# Patient Record
Sex: Female | Born: 1985
Health system: Southern US, Community
[De-identification: ages and names within clinical notes are randomized; demographics above are authoritative.]

## PROBLEM LIST (undated history)

## (undated) ENCOUNTER — Ambulatory Visit: Payer: BC Managed Care – PPO | Source: Home / Self Care

## (undated) ENCOUNTER — Ambulatory Visit: Admission: EM | Payer: BC Managed Care – PPO | Source: Home / Self Care

## (undated) DIAGNOSIS — R51 Headache: Secondary | ICD-10-CM

## (undated) DIAGNOSIS — K5900 Constipation, unspecified: Secondary | ICD-10-CM

## (undated) DIAGNOSIS — D509 Iron deficiency anemia, unspecified: Secondary | ICD-10-CM

## (undated) DIAGNOSIS — L309 Dermatitis, unspecified: Secondary | ICD-10-CM

## (undated) DIAGNOSIS — R519 Headache, unspecified: Secondary | ICD-10-CM

## (undated) HISTORY — DX: Headache: R51

## (undated) HISTORY — DX: Iron deficiency anemia, unspecified: D50.9

## (undated) HISTORY — DX: Headache, unspecified: R51.9

## (undated) HISTORY — DX: Dermatitis, unspecified: L30.9

## (undated) HISTORY — DX: Constipation, unspecified: K59.00

---
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
## 2011-02-18 ENCOUNTER — Other Ambulatory Visit: Payer: Self-pay | Admitting: Obstetrics and Gynecology

## 2011-02-18 DIAGNOSIS — N6320 Unspecified lump in the left breast, unspecified quadrant: Secondary | ICD-10-CM

## 2011-02-19 ENCOUNTER — Other Ambulatory Visit: Payer: Self-pay

## 2011-02-23 ENCOUNTER — Other Ambulatory Visit: Payer: Self-pay

## 2011-03-01 ENCOUNTER — Ambulatory Visit
Admission: RE | Admit: 2011-03-01 | Discharge: 2011-03-01 | Disposition: A | Discharge status: Home / Self Care | Payer: Managed Care, Other (non HMO) | Source: Ambulatory Visit | Attending: Obstetrics and Gynecology | Admitting: Obstetrics and Gynecology

## 2011-03-01 DIAGNOSIS — N6320 Unspecified lump in the left breast, unspecified quadrant: Secondary | ICD-10-CM

## 2011-09-28 ENCOUNTER — Ambulatory Visit (INDEPENDENT_AMBULATORY_CARE_PROVIDER_SITE_OTHER): Payer: Managed Care, Other (non HMO) | Admitting: Family Medicine

## 2011-09-28 VITALS — BP 125/75 | HR 64 | Temp 98.3°F | Resp 16 | Ht 66.0 in | Wt 120.0 lb

## 2011-09-28 DIAGNOSIS — K529 Noninfective gastroenteritis and colitis, unspecified: Secondary | ICD-10-CM

## 2011-09-28 DIAGNOSIS — K5289 Other specified noninfective gastroenteritis and colitis: Secondary | ICD-10-CM

## 2011-09-28 LAB — POCT CBC
Granulocyte percent: 45.9 %G (ref 37–80)
HCT, POC: 38.4 % (ref 37.7–47.9)
Hemoglobin: 11.8 g/dL — AB (ref 12.2–16.2)
Lymph, poc: 2.2 (ref 0.6–3.4)
MCH, POC: 25.5 pg — AB (ref 27–31.2)
MCHC: 30.7 g/dL — AB (ref 31.8–35.4)
MCV: 83 fL (ref 80–97)
MID (cbc): 0.4 (ref 0–0.9)
MPV: 10.7 fL (ref 0–99.8)
POC Granulocyte: 2.2 (ref 2–6.9)
POC LYMPH PERCENT: 46.3 %L (ref 10–50)
POC MID %: 7.8 %M (ref 0–12)
Platelet Count, POC: 278 10*3/uL (ref 142–424)
RBC: 4.63 M/uL (ref 4.04–5.48)
RDW, POC: 16 %
WBC: 4.7 10*3/uL (ref 4.6–10.2)

## 2011-09-28 LAB — POCT SEDIMENTATION RATE: POCT SED RATE: 9 mm/hr (ref 0–22)

## 2011-09-28 MED ORDER — FLUCONAZOLE 150 MG PO TABS: ORAL_TABLET | ORAL | Status: DC

## 2011-09-28 MED ORDER — CIPROFLOXACIN HCL 500 MG PO TABS: 500.0000 mg | ORAL_TABLET | Freq: Two times a day (BID) | ORAL | Status: AC

## 2011-09-28 NOTE — Patient Instructions (Signed)
Travelers' Diarrhea Travelers' diarrhea (TD) is the most common illness affecting travelers. Each year many travelers develop diarrhea. TD usually occurs within the first week of travel. However, it may occur at any time while traveling. It may even occur after returning home. The most important risk factor is where you are going. High-risk places are the developing countries of:  [place].   [place].   The [place].   [place].  High risk people include young adults and those with:  Transplants.   HIV infections.   Medicine that suppresses the immune system.   Inflammatory-bowel disease.   Diabetes.   H-2 blockers or antacids.  Attack rates are similar for men and women. The primary source of TD is eating or drinking food or water tainted with feces (stool or bowel movements). CAUSES  Infectious agents are the primary cause of TD. Germs cause almost 80% of TD cases. The most common germ produces:  Watery diarrhea with cramps.   Low-grade or no fever.  There are many other bacterial, viral and parasitic pathogens (disease causing "bugs").  SYMPTOMS  Most TD cases begin suddenly. Symptoms include stool that is increased in:  Frequency.   Volume.   Weight.  Altered stool consistency also is common. Typically, you have four to five loose or watery bowel movements each day. Other common symptoms are:  Nausea.   Vomiting.   Diarrhea.   Abdominal cramping.   Bloating.   Fever.   Urgency.   Malaise.  Most cases are not dangerous. Most cases go away in 1-2 days without treatment. TD is rarely life threatening. 90% of cases resolve within 1 week. 98% resolve within 1 month. PREVENTION   Avoid foods or beverages purchased from street vendors in high risk countries.   Avoid food from places where unclean conditions are present.   Avoid raw or undercooked meat and seafood.   Avoid raw fruits (e.g., oranges, bananas, avocados) and vegetables unless you peel them  yourself.   If handled properly, well-cooked and packaged foods usually are safe. Foods associated with increased risk for TD include:   Tap water.   Ice.   Unpasteurized milk.   Dairy products.   Safe beverages include:   Bottled carbonated beverages.   Hot tea or coffee.   Beer.   Wine.   Boiled water.   Water treated with iodine or chlorine.  ANTIBIOTICS ARE NOT RECOMMENDED AS PREVENTION  CDC (Centers for Disease Control) does not recommend antimicrobial drugs (medicine that kill germs) to prevent TD. Several studies show that Pepto-Bismol taken as either 2 tablets 4 times daily, or 2 fluid ounces 4 times daily, reduces the incidence of travelers' diarrhea. People that should avoid Pepto-Bismol include those who are:   Pregnant.   Allergic to aspirin.   Taking anticoagulants medicine (probenecid, methotrexate).   Be informed about potential side effects, in particular about temporary blackening of the tongue and stool, and rarely ringing in the ears. Because of potential adverse side effects, preventative Pepto-Bismol should not be used for more than 3 weeks.   Some antibiotics taken in a once-a-day dose are 90% effective at preventing travelers' diarrhea. However, antibiotics are not recommended as prevention. Routine antimicrobial prophylaxis increases your risk for:   Adverse reactions.   Infections with resistant organisms.   Antibiotics can increase your susceptibility to resistant bacterial pathogens and provide no protection against either viral or parasitic pathogens. This can give travelers a false sense of security. As a result, strict adherence   to preventive measures is encouraged. Pepto-Bismol should be used as an extra effort if prophylaxis is needed.  TREATMENT   TD usually is a self-limited disorder. It gets well without treatment. It often goes away without specific treatment. Oral re-hydration is often helpful to replace lost fluids and  electrolytes. Clear liquids are routinely recommended for adults. You may be helped with antimicrobial therapy if you develop three or more loose stools in an 8-hour period, especially if associated with:   Nausea.   Vomiting.   Abdominal cramps.   Fever.   Blood in stools.   Antibiotics usually are given for 3-5 days. Pepto-Bismol also may be used as treatment. Take one fluid ounce, or two 262 mg tablets every 30 minutes, for up to 8 doses in a 24-hour period. This can be repeated on a second day. If diarrhea persists despite therapy, you should be evaluated by a caregiver and treated for possible parasitic infection.   Because drug resistance is a continuing problem and may vary from country to country, professional assistance should be looked for if problems persist.   Antimotility agents (loperamide, diphenoxylate, and paregoric) mostly reduce diarrhea by slowing down the passage of food and drink in the gut. This allows more time for absorption. Some persons believe diarrhea is the body's defense mechanism to minimize contact time between gut pathogens and lining of the bowel. In several studies, antimotility agents have been useful in treating travelers' diarrhea by decreasing the duration of diarrhea. However, these agents should never be used by persons with fever or bloody diarrhea because they can increase the severity of disease by delaying clearance of causative organisms. Because antimotility agents are now available over the counter, their improper use is of concern. Complications have been reported from the use of these medicines such as:   Toxic megacolon.   Sepsis.   Disseminated intravascular coagulation.  SEEK IMMEDIATE MEDICAL CARE IF:   You are unable to keep fluids down.   Vomiting or diarrhea becomes persistent.   Abdominal (belly) pain develops or increases or localizes. (Right sided pain can be appendicitis and left sided pain in adults can be diverticulitis).     You develop an oral temperature above 102 F (38.9 C), or as your caregiver suggests.   Diarrhea becomes excessive or contains blood or mucous.   Excessive weakness, dizziness, fainting or extreme thirst.   Checking weight 2 to 3 times per day in babies and children will help verify adequate fluid replacement. Your caregiver will tell you what loss should concern you or suggest another visit to your personal physician.   Record your weight or your child's weight today. Compare this to your home scale and record all weights and time and date weighed. Try to check weight at the same times every day. Bring this chart to your caregivers if you or your child needs to be seen again.  FOR MORE INFORMATION  Travelers should consult with a caregiver before departing on a trip abroad. Information about TD is available from:  Your local or state health departments.   World Health Organization (WHO).  Other information that may be of interest to travelers can be found at the CDC Travelers' Health homepage at http://www.cdc.gov/travel. Document Released: 04/16/2002 Document Revised: 04/15/2011 Document Reviewed: 07/04/2008 ExitCare Patient Information 2012 [employer]. 

## 2011-09-28 NOTE — Progress Notes (Signed)
  Subjective:    Patient ID: Stacey Morrow, female    DOB: October 25, 1985, 26 y.o.   MRN: [id number]  HPI Comments: Abdominal cramps and diarrhea x 2 weeks.  Loose stools.  No blood in stools.  No fevers. No lightedness or dizziness.  Notable weight loss - about 4 pounds.  sxs started after a weekend in [facility] about 2 weeks ago.  No family history of UC or chrohn's disease.  Father with cholecystectomy.  No history of eating disorder.  Urinating ok.  History of irregular cycles.  Diarrhea   Abdominal Cramping Associated symptoms include diarrhea.      Review of Systems  Gastrointestinal: Positive for diarrhea.       Objective:   Physical Exam  Constitutional: She appears well-developed and well-nourished.  Cardiovascular: Normal rate, regular rhythm and normal heart sounds.  Exam reveals no gallop.   No murmur heard. Pulmonary/Chest: Effort normal and breath sounds normal.  Abdominal: Soft. Bowel sounds are normal. There is no hepatosplenomegaly. There is no tenderness. There is no rebound. Hernia confirmed negative in the ventral area.    Skin: Skin is warm and dry.  Psychiatric: She has a normal mood and affect.   Results for orders placed in visit on 09/28/11  POCT CBC      Component Value Range   WBC 4.7  4.6 - 10.2 (K/uL)   Lymph, poc 2.2  0.6 - 3.4    POC LYMPH PERCENT 46.3  10 - 50 (%L)   MID (cbc) 0.4  0 - 0.9    POC MID % 7.8  0 - 12 (%M)   POC Granulocyte 2.2  2 - 6.9    Granulocyte percent 45.9  37 - 80 (%G)   RBC 4.63  4.04 - 5.48 (M/uL)   Hemoglobin 11.8 (*) 12.2 - 16.2 (g/dL)   HCT, POC 40.9  81.1 - 47.9 (%)   MCV 83.0  80 - 97 (fL)   MCH, POC 25.5 (*) 27 - 31.2 (pg)   MCHC 30.7 (*) 31.8 - 35.4 (g/dL)   RDW, POC 91.4     Platelet Count, POC 278  142 - 424 (K/uL)   MPV 10.7  0 - 99.8 (fL)  CMET - pending       Assessment & Plan:  Gastroenteritis  cipro 500 mg bid x days  Diflucan 150 mg q3 days prn disp #2/ no RF  Increase fluids

## 2011-09-29 LAB — COMPREHENSIVE METABOLIC PANEL
ALT: 12 U/L (ref 0–35)
AST: 16 U/L (ref 0–37)
Albumin: 4.6 g/dL (ref 3.5–5.2)
Alkaline Phosphatase: 45 U/L (ref 39–117)
BUN: 12 mg/dL (ref 6–23)
CO2: 25 mEq/L (ref 19–32)
Calcium: 9.4 mg/dL (ref 8.4–10.5)
Chloride: 106 mEq/L (ref 96–112)
Creat: 0.64 mg/dL (ref 0.50–1.10)
Glucose, Bld: 77 mg/dL (ref 70–99)
Potassium: 3.9 mEq/L (ref 3.5–5.3)
Sodium: 138 mEq/L (ref 135–145)
Total Bilirubin: 0.5 mg/dL (ref 0.3–1.2)
Total Protein: 7.7 g/dL (ref 6.0–8.3)

## 2011-11-24 ENCOUNTER — Ambulatory Visit (INDEPENDENT_AMBULATORY_CARE_PROVIDER_SITE_OTHER): Payer: Managed Care, Other (non HMO) | Admitting: Emergency Medicine

## 2011-11-24 VITALS — BP 112/70 | HR 92 | Temp 99.1°F | Resp 18 | Ht 66.5 in | Wt 119.0 lb

## 2011-11-24 DIAGNOSIS — N898 Other specified noninflammatory disorders of vagina: Secondary | ICD-10-CM

## 2011-11-24 DIAGNOSIS — B9689 Other specified bacterial agents as the cause of diseases classified elsewhere: Secondary | ICD-10-CM

## 2011-11-24 DIAGNOSIS — N760 Acute vaginitis: Secondary | ICD-10-CM

## 2011-11-24 LAB — POCT WET PREP WITH KOH
KOH Prep POC: NEGATIVE
RBC Wet Prep HPF POC: NEGATIVE
Trichomonas, UA: NEGATIVE
Yeast Wet Prep HPF POC: NEGATIVE

## 2011-11-24 MED ORDER — METRONIDAZOLE 500 MG PO TABS: 500.0000 mg | ORAL_TABLET | Freq: Two times a day (BID) | ORAL | Status: AC

## 2011-11-24 NOTE — Patient Instructions (Signed)
Bacterial Vaginosis Bacterial vaginosis (BV) is a vaginal infection where the normal balance of bacteria in the vagina is disrupted. The normal balance is then replaced by an overgrowth of certain bacteria. There are several different kinds of bacteria that can cause BV. BV is the most common vaginal infection in women of childbearing age. CAUSES   The cause of BV is not fully understood. BV develops when there is an increase or imbalance of harmful bacteria.   Some activities or behaviors can upset the normal balance of bacteria in the vagina and put women at increased risk including:   Having a new sex partner or multiple sex partners.   Douching.   Using an intrauterine device (IUD) for contraception.   It is not clear what role sexual activity plays in the development of BV. However, women that have never had sexual intercourse are rarely infected with BV.  Women do not get BV from toilet seats, bedding, swimming pools or from touching objects around them.  SYMPTOMS   Grey vaginal discharge.   A fish-like odor with discharge, especially after sexual intercourse.   Itching or burning of the vagina and vulva.   Burning or pain with urination.   Some women have no signs or symptoms at all.  DIAGNOSIS  Your caregiver must examine the vagina for signs of BV. Your caregiver will perform lab tests and look at the sample of vaginal fluid through a microscope. They will look for bacteria and abnormal cells (clue cells), a pH test higher than 4.5, and a positive amine test all associated with BV.  RISKS AND COMPLICATIONS   Pelvic inflammatory disease (PID).   Infections following gynecology surgery.   Developing HIV.   Developing herpes virus.  TREATMENT  Sometimes BV will clear up without treatment. However, all women with symptoms of BV should be treated to avoid complications, especially if gynecology surgery is planned. Female partners generally do not need to be treated. However,  BV may spread between female sex partners so treatment is helpful in preventing a recurrence of BV.   BV may be treated with antibiotics. The antibiotics come in either pill or vaginal cream forms. Either can be used with nonpregnant or pregnant women, but the recommended dosages differ. These antibiotics are not harmful to the baby.   BV can recur after treatment. If this happens, a second round of antibiotics will often be prescribed.   Treatment is important for pregnant women. If not treated, BV can cause a premature delivery, especially for a pregnant woman who had a premature birth in the past. All pregnant women who have symptoms of BV should be checked and treated.   For chronic reoccurrence of BV, treatment with a type of prescribed gel vaginally twice a week is helpful.  HOME CARE INSTRUCTIONS   Finish all medication as directed by your caregiver.   Do not have sex until treatment is completed.   Tell your sexual partner that you have a vaginal infection. They should see their caregiver and be treated if they have problems, such as a mild rash or itching.   Practice safe sex. Use condoms. Only have 1 sex partner.  PREVENTION  Basic prevention steps can help reduce the risk of upsetting the natural balance of bacteria in the vagina and developing BV:  Do not have sexual intercourse (be abstinent).   Do not douche.   Use all of the medicine prescribed for treatment of BV, even if the signs and symptoms go away.     Tell your sex partner if you have BV. That way, they can be treated, if needed, to prevent reoccurrence.  SEEK MEDICAL CARE IF:   Your symptoms are not improving after 3 days of treatment.   You have increased discharge, pain, or fever.  MAKE SURE YOU:   Understand these instructions.   Will watch your condition.   Will get help right away if you are not doing well or get worse.  FOR MORE INFORMATION  Division of STD Prevention (DSTDP), Centers for Disease  Control and Prevention: www.cdc.gov/std American Social Health Association (ASHA): www.ashastd.org  Document Released: 04/26/2005 Document Revised: 04/15/2011 Document Reviewed: 10/17/2008 ExitCare Patient Information 2012 [employer]. 

## 2011-11-24 NOTE — Progress Notes (Signed)
  Subjective:    Patient ID: Stacey Morrow, female    DOB: 15-Oct-1985, 26 y.o.   MRN: [id number]  Gynecologic Exam The patient's primary symptoms include genital lesions and a vaginal discharge. The patient's pertinent negatives include no genital itching, genital odor, genital rash, missed menses, pelvic pain or vaginal bleeding. This is a new problem. The current episode started yesterday. The problem occurs constantly. The problem has been unchanged. The patient is experiencing no pain. She is not pregnant. Pertinent negatives include no abdominal pain, anorexia, back pain, chills, constipation, diarrhea, discolored urine, dysuria, fever, flank pain, frequency, headaches, hematuria, joint pain, joint swelling, nausea, painful intercourse, rash, sore throat, urgency or vomiting. The vaginal discharge was milky and mucoid. There has been no bleeding. She has not been passing clots. She has not been passing tissue. Nothing aggravates the symptoms. She has tried nothing for the symptoms. She is sexually active. No, her partner does not have an STD. There is no history of an abdominal surgery, a Cesarean section, an ectopic pregnancy, endometriosis, a gynecological surgery, herpes simplex, menorrhagia, metrorrhagia, miscarriage, ovarian cysts, perineal abscess, PID, an STD, a terminated pregnancy or vaginosis.      Review of Systems  Constitutional: Negative for fever and chills.  HENT: Negative.  Negative for sore throat.   Eyes: Negative.   Respiratory: Negative.   Gastrointestinal: Negative for nausea, vomiting, abdominal pain, diarrhea, constipation and anorexia.  Genitourinary: Positive for vaginal discharge. Negative for dysuria, urgency, frequency, hematuria, flank pain, pelvic pain, menorrhagia and missed menses.  Musculoskeletal: Negative for back pain and joint pain.  Skin: Negative for rash.  Neurological: Negative for headaches.       Objective:   Physical Exam  Constitutional: She  appears well-developed and well-nourished.  HENT:  Head: Normocephalic and atraumatic.  Eyes: Conjunctivae are normal. Pupils are equal, round, and reactive to light.  Neck: Normal range of motion. Neck supple.  Cardiovascular: Normal rate.   Pulmonary/Chest: Effort normal.  Abdominal: Soft.  Genitourinary: Uterus normal. Vaginal discharge (thick mucoid white) found.  Musculoskeletal: Normal range of motion.  Skin: Skin is warm and dry.          Assessment & Plan:  Bacterial vaginosis  Results for orders placed in visit on 11/24/11  POCT WET PREP WITH KOH      Component Value Range   Trichomonas, UA Negative     Clue Cells Wet Prep HPF POC 1-4     Epithelial Wet Prep HPF POC 3-10     Yeast Wet Prep HPF POC negative     Bacteria Wet Prep HPF POC 3+     RBC Wet Prep HPF POC negative     WBC Wet Prep HPF POC 1-3     KOH Prep POC Negative

## 2011-11-25 LAB — GC/CHLAMYDIA PROBE AMP, GENITAL
Chlamydia, DNA Probe: NEGATIVE
GC Probe Amp, Genital: NEGATIVE

## 2011-11-26 ENCOUNTER — Encounter: Payer: Self-pay | Admitting: *Deleted

## 2012-01-21 ENCOUNTER — Ambulatory Visit (INDEPENDENT_AMBULATORY_CARE_PROVIDER_SITE_OTHER): Payer: Managed Care, Other (non HMO) | Admitting: Internal Medicine

## 2012-01-21 VITALS — BP 115/74 | HR 79 | Temp 98.3°F | Resp 16 | Ht 67.0 in | Wt 121.0 lb

## 2012-01-21 DIAGNOSIS — L259 Unspecified contact dermatitis, unspecified cause: Secondary | ICD-10-CM

## 2012-01-21 DIAGNOSIS — L309 Dermatitis, unspecified: Secondary | ICD-10-CM

## 2012-01-21 DIAGNOSIS — R42 Dizziness and giddiness: Secondary | ICD-10-CM

## 2012-01-21 LAB — GLUCOSE, POCT (MANUAL RESULT ENTRY): POC Glucose: 88 mg/dl (ref 70–99)

## 2012-01-21 MED ORDER — FLUOCINONIDE-E 0.05 % EX CREA: TOPICAL_CREAM | Freq: Two times a day (BID) | CUTANEOUS | Status: AC

## 2012-01-21 NOTE — Progress Notes (Signed)
Subjective:    Patient ID: Stacey Morrow, female    DOB: 15-Feb-1986, 26 y.o.   MRN: [id number]  HPI This 26 y.o. female presents for evaluation of an episode of nausea, shakiness and dizziness at work today.  Her mom advised she get her blood sugar checked.  She feels better now.  Notes that she drinks a lot of soda (4-5 daily).  Ate breakfast today for the first time in a long while.  Last dose of metronidazole for vaginal infection was last night.  LMP about a month ago.  Inconsistent condom use. No GI/GU symptoms.  No unexplained myalgias, arthralgias.    Has a piece of skin that feels hard on the right chest wall.  Has seen two other providers about it and has gotten two different diagnoses: fungus and eczema. She had no improvement with anti-fungal cream, and the steroid she was given was too expensive.  She used a different steroid cream she uses for her eczema flares without benefit.  It doesn't itch.  Nothing in particular seemed to aggravate it, though she notes that her bra irritates it sometimes (she wears underwire bras).    Review of Systems As above.   History reviewed. No pertinent past medical history.  Past Surgical History  Procedure Date  . Anterior cruciate ligament repair 2006    in [place], [place]; hurdler in high school    Prior to Admission medications   Medication Sig Start Date End Date Taking? Authorizing Provider  metroNIDAZOLE (FLAGYL) 500 MG tablet Take 500 mg by mouth 2 (two) times daily. 01/19/12  Yes Historical Provider, MD    No Known Allergies  History   Social History  . Marital Status: Single    Spouse Name: n/a    Number of Children: 0  . Years of Education: 16   Occupational History  . [employer] scientist (medical)    Social History Main Topics  . Smoking status: Never Smoker   . Smokeless tobacco: Never Used  . Alcohol Use: Yes     occasionally  . Drug Use: No  . Sexually Active: Yes -- Female partner(s)    Birth Control/ Protection: Condom       inconsistent   Other Topics Concern  . Not on file   Social History Narrative   Graduated from [employer] with a degree in Chemistry.    Family History  Problem Relation Age of Onset  . Hypertension Mother   . Hypertension Father        Objective:   Physical Exam Blood pressure 115/74, pulse 79, temperature 98.3 F (36.8 C), temperature source Oral, resp. rate 16, height 5\' 7"  (1.702 m), weight 121 lb (54.885 kg), last menstrual period 12/21/2011, SpO2 100.00%. Body mass index is 18.95 kg/(m^2). Well-developed, well nourished BF who is awake, alert and oriented, in NAD. HEENT: [place]/AT, PERRL, EOMI.  Sclera and conjunctiva are clear. Fundi are normal bilaterally. EAC are patent, TMs are normal in appearance. Nasal mucosa is pink and moist. OP is clear. Neck: supple, non-tender, no lymphadenopathy, thyromegaly. Heart: RRR, no murmur Lungs: normal effort, CTA Abdomen: normo-active bowel sounds, supple, non-tender, no mass or organomegaly. Extremities: no cyanosis, clubbing or edema. Skin: warm and dry with an ovoid hypopigmented area on the right chest wall. Centrally, there is hyperkeratotic, hyperpigmented skin.  There is an arc of scattered hypopigmented macular areas lower on the chest wall.   Results for orders placed in visit on 01/21/12  GLUCOSE, POCT (MANUAL RESULT ENTRY)  Component Value Range   POC Glucose 88  70 - 99 mg/dl        Assessment & Plan:   1. Dizziness - light-headed  POCT glucose (manual entry)  2. Eczema  fluocinonide-emollient (LIDEX-E) 0.05 % cream   Rest, fluids, small frequent meals/snacks. Cut back on soda. If dizziness recurs, RTC.  Consistent condom use. RTC 4 weeks if no improvement in the eczema.

## 2012-01-21 NOTE — Patient Instructions (Addendum)
Over the next few days, get plenty of rest.  Drink 64 ounces of water daily to stay hydrated, and consider eating small, frequent snacks and cutting back on sodas. If the rash is not improving in 4 weeks, return for additional evaluation.

## 2012-05-15 ENCOUNTER — Ambulatory Visit (INDEPENDENT_AMBULATORY_CARE_PROVIDER_SITE_OTHER): Payer: BC Managed Care – PPO | Admitting: Emergency Medicine

## 2012-05-15 VITALS — BP 112/76 | HR 82 | Temp 98.8°F | Resp 16 | Ht 67.0 in | Wt 119.4 lb

## 2012-05-15 DIAGNOSIS — M436 Torticollis: Secondary | ICD-10-CM

## 2012-05-15 MED ORDER — CYCLOBENZAPRINE HCL 10 MG PO TABS: 10.0000 mg | ORAL_TABLET | Freq: Three times a day (TID) | ORAL | Status: DC | PRN

## 2012-05-15 MED ORDER — NAPROXEN SODIUM 550 MG PO TABS: 550.0000 mg | ORAL_TABLET | Freq: Two times a day (BID) | ORAL | Status: AC

## 2012-05-15 MED ORDER — HYDROCODONE-ACETAMINOPHEN 5-325 MG PO TABS: 1.0000 | ORAL_TABLET | ORAL | Status: DC | PRN

## 2012-05-15 NOTE — Patient Instructions (Signed)

## 2012-05-15 NOTE — Progress Notes (Signed)
[facility] [address], [place] [address] [phone]  Date:  05/15/2012   Name:  Stacey Morrow   DOB:  02-02-1986   MRN:  [id number]  PCP:  No primary provider on file.    Chief Complaint: Neck Pain, Back Pain and Shoulder Pain   History of Present Illness:  Stacey Morrow is a 27 y.o. very pleasant female patient who presents with the following:  No history of trauma.  Slept on a couch at a friend's and awoke with pain in the neck and an inability to look to the left.  No radiation of pain or neurological symptoms.  No history of overuse.  Denies other complaints.  There is no problem list on file for this patient.   Past Medical History  Diagnosis Date  . Eczema     Past Surgical History  Procedure Date  . Anterior cruciate ligament repair 2006    in [place], [place]; hurdler in high school    History  Substance Use Topics  . Smoking status: Never Smoker   . Smokeless tobacco: Never Used  . Alcohol Use: Yes     Comment: occasionally    Family History  Problem Relation Age of Onset  . Hypertension Mother   . Hypertension Father     No Known Allergies  Medication list has been reviewed and updated.  Current Outpatient Prescriptions on File Prior to Visit  Medication Sig Dispense Refill  . fluocinonide-emollient (LIDEX-E) 0.05 % cream Apply topically 2 (two) times daily.  30 g  0  . metroNIDAZOLE (FLAGYL) 500 MG tablet Take 500 mg by mouth 2 (two) times daily.        Review of Systems:  As per HPI, otherwise negative.    Physical Examination: Filed Vitals:   05/15/12 1531  BP: 112/76  Pulse: 82  Temp: 98.8 F (37.1 C)  Resp: 16   Filed Vitals:   05/15/12 1531  Height: 5\' 7"  (1.702 m)  Weight: 119 lb 6.4 oz (54.159 kg)   Body mass index is 18.70 kg/(m^2). Ideal Body Weight: Weight in (lb) to have BMI = 25: 159.3    GEN: WDWN, NAD, Non-toxic, Alert & Oriented x 3 HEENT: Atraumatic, Normocephalic.  Ears and Nose: No  external deformity. EXTR: No clubbing/cyanosis/edema NEURO: Normal gait.  PSYCH: Normally interactive. Conversant. Not depressed or anxious appearing.  Calm demeanor.  NECK:  Marked spasm trapezius right more than left.  Neuro grossly intact  Assessment and Plan: torticolis Anaprox Flexeril vicodin Local heating pad  Carmelina Dane, MD

## 2012-10-29 ENCOUNTER — Ambulatory Visit (INDEPENDENT_AMBULATORY_CARE_PROVIDER_SITE_OTHER): Payer: BC Managed Care – PPO | Admitting: Emergency Medicine

## 2012-10-29 VITALS — BP 120/78 | HR 103 | Temp 98.4°F | Resp 16 | Ht 66.5 in | Wt 122.6 lb

## 2012-10-29 DIAGNOSIS — M2550 Pain in unspecified joint: Secondary | ICD-10-CM

## 2012-10-29 LAB — COMPREHENSIVE METABOLIC PANEL
Albumin: 3.5 g/dL (ref 3.5–5.2)
BUN: 9 mg/dL (ref 6–23)
Calcium: 9.1 mg/dL (ref 8.4–10.5)
Chloride: 107 mEq/L (ref 96–112)
Creat: 0.74 mg/dL (ref 0.50–1.10)
Glucose, Bld: 109 mg/dL — ABNORMAL HIGH (ref 70–99)
Potassium: 3.9 mEq/L (ref 3.5–5.3)

## 2012-10-29 LAB — POCT CBC
Hemoglobin: 11.1 g/dL — AB (ref 12.2–16.2)
Lymph, poc: 1.2 (ref 0.6–3.4)
MCH, POC: 26.3 pg — AB (ref 27–31.2)
MCHC: 30.7 g/dL — AB (ref 31.8–35.4)
MCV: 85.8 fL (ref 80–97)
MID (cbc): 0.3 (ref 0–0.9)
RBC: 4.22 M/uL (ref 4.04–5.48)
WBC: 4.2 10*3/uL — AB (ref 4.6–10.2)

## 2012-10-29 LAB — RHEUMATOID FACTOR: Rhuematoid fact SerPl-aCnc: <10 IU/mL (ref <=14)

## 2012-10-29 MED ORDER — MELOXICAM 15 MG PO TABS: 15.0000 mg | ORAL_TABLET | Freq: Every day | ORAL | Status: DC

## 2012-10-29 NOTE — Progress Notes (Signed)
[facility] [address], [place] [address] [phone]  Date:  10/29/2012   Name:  Stacey Morrow   DOB:  26-Jul-1985   MRN:  [id number]  PCP:  No PCP Per Patient    Chief Complaint: Knee Pain, Ankle Pain and Hip Pain   History of Present Illness:  Stacey Morrow is a 27 y.o. very pleasant female patient who presents with the following:  Runs and works out daily.  Over a week ago had pain in left knee.  And laid off working out.  Since visiting home in [place] last weekend she has experienced pain in both knees, then hips and now ankles as well.  No effusions. No history of injury or overuse.  She denies rash, fever, chills, cough or coryza. No antecedent illness, no lymphadenopathy.  No inflammatory joint disease in family.   Now pain interferes with sleep and is sufficiently severe that it impairs walking.  No improvement with over the counter medications or other home remedies. Denies other complaint or health concern today.   There are no active problems to display for this patient.   Past Medical History  Diagnosis Date  . Eczema     Past Surgical History  Procedure Laterality Date  . Anterior cruciate ligament repair  2006    in [place], [place]; hurdler in high school    History  Substance Use Topics  . Smoking status: Never Smoker   . Smokeless tobacco: Never Used  . Alcohol Use: Yes     Comment: occasionally    Family History  Problem Relation Age of Onset  . Hypertension Mother   . Hypertension Father     No Known Allergies  Medication list has been reviewed and updated.  Current Outpatient Prescriptions on File Prior to Visit  Medication Sig Dispense Refill  . cyclobenzaprine (FLEXERIL) 10 MG tablet Take 1 tablet (10 mg total) by mouth 3 (three) times daily as needed for muscle spasms.  30 tablet  0  . fluocinolone (VANOS) 0.01 % cream Apply topically 2 (two) times daily.      . fluocinonide-emollient (LIDEX-E) 0.05 % cream Apply  topically 2 (two) times daily.  30 g  0  . HYDROcodone-acetaminophen (NORCO) 5-325 MG per tablet Take 1 tablet by mouth every 4 (four) hours as needed for pain.  30 tablet  0  . metroNIDAZOLE (FLAGYL) 500 MG tablet Take 500 mg by mouth 2 (two) times daily.      . naproxen sodium (ANAPROX DS) 550 MG tablet Take 1 tablet (550 mg total) by mouth 2 (two) times daily with a meal.  40 tablet  0   No current facility-administered medications on file prior to visit.    Review of Systems:  As per HPI, otherwise negative.    Physical Examination: Filed Vitals:   10/29/12 1435  BP: 120/78  Pulse: 103  Temp: 98.4 F (36.9 C)  Resp: 16   Filed Vitals:   10/29/12 1435  Height: 5' 6.5" (1.689 m)  Weight: 122 lb 9.6 oz (55.611 kg)   Body mass index is 19.49 kg/(m^2). Ideal Body Weight: Weight in (lb) to have BMI = 25: 156.9  GEN: WDWN, NAD, Non-toxic, A & O x 3 HEENT: Atraumatic, Normocephalic. Neck supple. No masses, No LAD. Ears and Nose: No external deformity. CV: RRR, No M/G/R. No JVD. No thrill. No extra heart sounds. PULM: CTA B, no wheezes, crackles, rhonchi. No retractions. No resp. distress. No accessory muscle use.  ABD: S, NT, ND, +BS. No rebound. No HSM. EXTR: No c/c/e.  Knees bilaterally tender lateral joints.  Some effusion.   Ankles tender medially no effusion.  Both hips tender laterally full PROM NEURO Normal gait.  PSYCH: Normally interactive. Conversant. Not depressed or anxious appearing.  Calm demeanor.    Assessment and Plan: polyarthralgias Labs mobic Follow up in one week   Signed,  Phillips Odor, MD

## 2012-10-30 LAB — ANA: Anti Nuclear Antibody(ANA): NEGATIVE

## 2012-10-30 LAB — ANTI-DNA ANTIBODY, DOUBLE-STRANDED: ds DNA Ab: 3 IU/mL (ref <30)

## 2012-10-31 LAB — LYME, IGM, EARLY TEST/REFLEX: Lyme Disease Ab, Quant, IgM: <0.91 index (ref 0.00–0.90)

## 2012-11-21 ENCOUNTER — Telehealth: Payer: Self-pay

## 2012-11-21 NOTE — Telephone Encounter (Signed)
It is a rather nonspecific test of inflammation and that is not a high number

## 2012-11-21 NOTE — Telephone Encounter (Signed)
[phone].     Value  Range   ASO 648 (H)   <409 IU/mL   Patient has questions about the ASO testing appears elevated, she was sent normal lab letter. I am not familiar with this lab, will you explain to me?

## 2012-11-21 NOTE — Telephone Encounter (Signed)
Patient called in stating she was seen recently and had some test done.  She has a question about some of the results.  Please call her at [phone].

## 2012-11-21 NOTE — Telephone Encounter (Signed)
Patient advised. She is advised to return to clinic for repeat evaluation since she is still painful.

## 2013-08-29 ENCOUNTER — Other Ambulatory Visit: Payer: Self-pay | Admitting: Obstetrics and Gynecology

## 2013-08-29 DIAGNOSIS — D242 Benign neoplasm of left breast: Secondary | ICD-10-CM

## 2013-08-29 DIAGNOSIS — N63 Unspecified lump in unspecified breast: Secondary | ICD-10-CM

## 2013-09-06 ENCOUNTER — Ambulatory Visit
Admission: RE | Admit: 2013-09-06 | Discharge: 2013-09-06 | Disposition: A | Discharge status: Home / Self Care | Payer: BC Managed Care – PPO | Source: Ambulatory Visit | Attending: Obstetrics and Gynecology | Admitting: Obstetrics and Gynecology

## 2013-09-06 ENCOUNTER — Encounter (INDEPENDENT_AMBULATORY_CARE_PROVIDER_SITE_OTHER): Payer: Self-pay

## 2013-09-06 DIAGNOSIS — N63 Unspecified lump in unspecified breast: Secondary | ICD-10-CM

## 2013-09-06 DIAGNOSIS — D242 Benign neoplasm of left breast: Secondary | ICD-10-CM

## 2013-09-24 ENCOUNTER — Emergency Department (HOSPITAL_COMMUNITY)
Admission: EM | Admit: 2013-09-24 | Discharge: 2013-09-24 | Disposition: A | Discharge status: Home / Self Care | Payer: BC Managed Care – PPO | Source: Home / Self Care | Attending: Emergency Medicine | Admitting: Emergency Medicine

## 2013-09-24 ENCOUNTER — Encounter (HOSPITAL_COMMUNITY): Payer: Self-pay | Admitting: Emergency Medicine

## 2013-09-24 DIAGNOSIS — J302 Other seasonal allergic rhinitis: Secondary | ICD-10-CM

## 2013-09-24 DIAGNOSIS — J309 Allergic rhinitis, unspecified: Secondary | ICD-10-CM

## 2013-09-24 NOTE — Discharge Instructions (Signed)
Purchase Claritin-D, Zyrtec-D or Allegra-D (or generic versions) and take as directed on the package.    Allergic Rhinitis Allergic rhinitis is when the mucous membranes in the nose respond to allergens. Allergens are particles in the air that cause your body to have an allergic reaction. This causes you to release allergic antibodies. Through a chain of events, these eventually cause you to release histamine into the blood stream. Although meant to protect the body, it is this release of histamine that causes your discomfort, such as frequent sneezing, congestion, and an itchy, runny nose.  CAUSES  Seasonal allergic rhinitis (hay fever) is caused by pollen allergens that may come from grasses, trees, and weeds. Year-round allergic rhinitis (perennial allergic rhinitis) is caused by allergens such as house dust mites, pet dander, and mold spores.  SYMPTOMS   Nasal stuffiness (congestion).  Itchy, runny nose with sneezing and tearing of the eyes. DIAGNOSIS  Your health care provider can help you determine the allergen or allergens that trigger your symptoms. If you and your health care provider are unable to determine the allergen, skin or blood testing may be used. TREATMENT  Allergic Rhinitis does not have a cure, but it can be controlled by:  Medicines and allergy shots (immunotherapy).  Avoiding the allergen. Hay fever may often be treated with antihistamines in pill or nasal spray forms. Antihistamines block the effects of histamine. There are over-the-counter medicines that may help with nasal congestion and swelling around the eyes. Check with your health care provider before taking or giving this medicine.  If avoiding the allergen or the medicine prescribed do not work, there are many new medicines your health care provider can prescribe. Stronger medicine may be used if initial measures are ineffective. Desensitizing injections can be used if medicine and avoidance does not work.  Desensitization is when a patient is given ongoing shots until the body becomes less sensitive to the allergen. Make sure you follow up with your health care provider if problems continue. HOME CARE INSTRUCTIONS It is not possible to completely avoid allergens, but you can reduce your symptoms by taking steps to limit your exposure to them. It helps to know exactly what you are allergic to so that you can avoid your specific triggers. SEEK MEDICAL CARE IF:   You have a fever.  You develop a cough that does not stop easily (persistent).  You have shortness of breath.  You start wheezing.  Symptoms interfere with normal daily activities. Document Released: 01/19/2001 Document Revised: 02/14/2013 Document Reviewed: 01/01/2013 [facility] Patient Information 2014 [place].

## 2013-09-24 NOTE — ED Notes (Signed)
C/o cold sx States she has a cough, itchy eyes, congestion and ears popped States theraflu and ibuprofen was used as tx

## 2013-09-24 NOTE — ED Provider Notes (Signed)
CSN: [id number]     Arrival date & time 09/24/13  1127 History   First MD Initiated Contact with Patient 09/24/13 1238     Chief Complaint  Patient presents with  . URI   (Consider location/radiation/quality/duration/timing/severity/associated sxs/prior Treatment) HPI Comments: Clear nasal drainage, itchy eyes.   Patient is a 28 y.o. female presenting with URI. The history is provided by the patient.  URI Presenting symptoms: congestion, cough, rhinorrhea and sore throat   Presenting symptoms: no ear pain, no facial pain and no fever   Severity:  Moderate Onset quality:  Gradual Duration:  8 days Timing:  Constant Progression:  Unchanged Chronicity:  New Relieved by:  Nothing Worsened by:  Nothing tried Ineffective treatments:  OTC medications (theraflu and ibuprofen) Associated symptoms: headaches   Associated symptoms: no sinus pain     Past Medical History  Diagnosis Date  . Eczema    Past Surgical History  Procedure Laterality Date  . Anterior cruciate ligament repair  2006    in [place], [place]; hurdler in high school   Family History  Problem Relation Age of Onset  . Hypertension Mother   . Hypertension Father    History  Substance Use Topics  . Smoking status: Never Smoker   . Smokeless tobacco: Never Used  . Alcohol Use: Yes     Comment: occasionally   OB History   Grav Para Term Preterm Abortions TAB SAB Ect Mult Living   0 0 0 0 0 0 0 0 0 0      Review of Systems  Constitutional: Negative for fever and chills.  HENT: Positive for congestion, postnasal drip, rhinorrhea and sore throat. Negative for ear pain and sinus pressure.   Eyes: Positive for itching.  Respiratory: Positive for cough.   Neurological: Positive for headaches.    Allergies  Review of patient's allergies indicates no known allergies.  Home Medications   Prior to Admission medications   Medication Sig Start Date End Date Taking? Authorizing Provider  cyclobenzaprine (FLEXERIL)  10 MG tablet Take 1 tablet (10 mg total) by mouth 3 (three) times daily as needed for muscle spasms. 05/15/12   Ellison Carwin, MD  fluocinolone (VANOS) 0.01 % cream Apply topically 2 (two) times daily.    Historical Provider, MD  HYDROcodone-acetaminophen (NORCO) 5-325 MG per tablet Take 1 tablet by mouth every 4 (four) hours as needed for pain. 05/15/12   Ellison Carwin, MD  meloxicam (MOBIC) 15 MG tablet Take 1 tablet (15 mg total) by mouth daily. 10/29/12   Ellison Carwin, MD  metroNIDAZOLE (FLAGYL) 500 MG tablet Take 500 mg by mouth 2 (two) times daily. 01/19/12   Historical Provider, MD   BP 127/88  Pulse 90  Temp(Src) 99 F (37.2 C) (Oral)  Resp 18  SpO2 99%  LMP 09/22/2013 Physical Exam  Constitutional: She appears well-developed and well-nourished. No distress.  HENT:  Right Ear: Tympanic membrane, external ear and ear canal normal.  Left Ear: Tympanic membrane, external ear and ear canal normal.  Nose: Mucosal edema present. No rhinorrhea. Right sinus exhibits no maxillary sinus tenderness and no frontal sinus tenderness. Left sinus exhibits no maxillary sinus tenderness and no frontal sinus tenderness.  Mouth/Throat: Oropharynx is clear and moist and mucous membranes are normal.  Cardiovascular: Normal rate and regular rhythm.   Pulmonary/Chest: Effort normal and breath sounds normal.  Lymphadenopathy:       Head (right side): No submental, no submandibular and no tonsillar adenopathy present.  Head (left side): No submental, no submandibular and no tonsillar adenopathy present.    She has no cervical adenopathy.    ED Course  Procedures (including critical care time) Labs Review Labs Reviewed - No data to display  Imaging Review No results found.   MDM   1. Seasonal allergies   most likely allergy sx. Suggested 2nd gen antihistamine with sudafed (such as claritin-D)     Carvel Getting, NP 09/24/13 1249

## 2013-09-25 NOTE — ED Provider Notes (Signed)
Medical screening examination/treatment/procedure(s) were performed by non-physician practitioner and as supervising physician I was immediately available for consultation/collaboration.  Philipp Deputy, M.D.  Harden Mo, MD 09/25/13 [phone]

## 2015-07-28 ENCOUNTER — Other Ambulatory Visit: Payer: Self-pay | Admitting: Occupational Medicine

## 2015-07-28 ENCOUNTER — Ambulatory Visit
Admission: RE | Admit: 2015-07-28 | Discharge: 2015-07-28 | Disposition: A | Discharge status: Home / Self Care | Payer: No Typology Code available for payment source | Source: Ambulatory Visit | Attending: Occupational Medicine | Admitting: Occupational Medicine

## 2015-07-28 DIAGNOSIS — Z021 Encounter for pre-employment examination: Secondary | ICD-10-CM

## 2015-12-17 DIAGNOSIS — L309 Dermatitis, unspecified: Secondary | ICD-10-CM | POA: Diagnosis not present

## 2016-01-07 DIAGNOSIS — L810 Postinflammatory hyperpigmentation: Secondary | ICD-10-CM | POA: Diagnosis not present

## 2016-01-07 DIAGNOSIS — L309 Dermatitis, unspecified: Secondary | ICD-10-CM | POA: Diagnosis not present

## 2016-01-13 DIAGNOSIS — Z6821 Body mass index (BMI) 21.0-21.9, adult: Secondary | ICD-10-CM | POA: Diagnosis not present

## 2016-01-13 DIAGNOSIS — Z113 Encounter for screening for infections with a predominantly sexual mode of transmission: Secondary | ICD-10-CM | POA: Diagnosis not present

## 2016-01-13 DIAGNOSIS — Z01419 Encounter for gynecological examination (general) (routine) without abnormal findings: Secondary | ICD-10-CM | POA: Diagnosis not present

## 2016-03-02 ENCOUNTER — Ambulatory Visit (INDEPENDENT_AMBULATORY_CARE_PROVIDER_SITE_OTHER): Payer: BLUE CROSS/BLUE SHIELD | Admitting: Physician Assistant

## 2016-03-02 VITALS — BP 122/72 | HR 77 | Temp 99.0°F | Resp 17 | Ht 66.5 in | Wt 131.0 lb

## 2016-03-02 DIAGNOSIS — B9689 Other specified bacterial agents as the cause of diseases classified elsewhere: Secondary | ICD-10-CM

## 2016-03-02 DIAGNOSIS — N898 Other specified noninflammatory disorders of vagina: Secondary | ICD-10-CM | POA: Diagnosis not present

## 2016-03-02 DIAGNOSIS — B373 Candidiasis of vulva and vagina: Secondary | ICD-10-CM

## 2016-03-02 DIAGNOSIS — N760 Acute vaginitis: Secondary | ICD-10-CM | POA: Diagnosis not present

## 2016-03-02 DIAGNOSIS — Z113 Encounter for screening for infections with a predominantly sexual mode of transmission: Secondary | ICD-10-CM

## 2016-03-02 DIAGNOSIS — B3731 Acute candidiasis of vulva and vagina: Secondary | ICD-10-CM

## 2016-03-02 LAB — POCT WET + KOH PREP: Trich by wet prep: ABSENT

## 2016-03-02 LAB — POCT URINE PREGNANCY: PREG TEST UR: NEGATIVE

## 2016-03-02 MED ORDER — FLUCONAZOLE 150 MG PO TABS: 150.0000 mg | ORAL_TABLET | Freq: Once | ORAL | 0 refills | Status: AC

## 2016-03-02 MED ORDER — METRONIDAZOLE 500 MG PO TABS: 500.0000 mg | ORAL_TABLET | Freq: Two times a day (BID) | ORAL | 0 refills | Status: DC

## 2016-03-02 NOTE — Progress Notes (Signed)
By signing my name below, I, Moises Blood, attest that this documentation has been prepared under the direction and in the presence of Philis Fendt, PA-C. Electronically Signed: Moises Blood, Scribe. 03/03/2016 , 5:03 PM .  Patient was seen in Room 3 .  03/03/2016 1:24 PM   DOB: 09-02-1985 / MRN: [id number]  SUBJECTIVE:  Stacey Morrow is a 30 y.o. female presenting for copious white vaginal discharge with some itching that started 2 days ago. She also noticed a foul odor that "smells like stool". She denies any fever, abdominal pain, lower abdominal pain, dysuria, urinary urgency, urinary frequency, or any change in bowel habits.   Of note, she also informs having same sexual partner (female) for the past 3 months. She would like STD screening today.   She has No Known Allergies.   She  has a past medical history of Eczema.    She  reports that she has never smoked. She has never used smokeless tobacco. She reports that she drinks alcohol. She reports that she does not use drugs. She  reports that she currently engages in sexual activity and has had female partners. She reports using the following method of birth control/protection: Condom. The patient  has a past surgical history that includes Anterior cruciate ligament repair (2006).  Her family history includes Hypertension in her father and mother.  Review of Systems  Constitutional: Negative for fever and malaise/fatigue.  Gastrointestinal: Negative for abdominal pain, blood in stool, constipation and diarrhea.  Genitourinary: Negative for dysuria, frequency and urgency.       Vaginal discharge Vaginal irritation  Neurological: Negative for dizziness.    The problem list and medications were reviewed and updated by myself where necessary and exist elsewhere in the encounter.   OBJECTIVE:  BP 122/72 (BP Location: Right Arm, Patient Position: Sitting, Cuff Size: Normal)   Pulse 77   Temp 99 F (37.2 C) (Oral)   Resp 17    Ht 5' 6.5" (1.689 m)   Wt 131 lb (59.4 kg)   LMP 02/15/2016 (Approximate)   SpO2 100%   BMI 20.83 kg/m   Physical Exam  Constitutional: She is oriented to person, place, and time. Vital signs are normal. She appears well-developed.  Cardiovascular: Normal rate.   Pulmonary/Chest: Effort normal. No respiratory distress.  Musculoskeletal: Normal range of motion.  Neurological: She is alert and oriented to person, place, and time.  Skin: Skin is warm and dry.  Psychiatric: She has a normal mood and affect. Her behavior is normal. Judgment and thought content normal.    Results for orders placed or performed in visit on 03/02/16 (from the past 72 hour(s))  GC/Chlamydia Probe Amp     Status: None   Collection Time: 03/02/16  5:20 PM  Result Value Ref Range   CT Probe RNA NOT DETECTED     Comment:                    **Normal Reference Range: NOT DETECTED**   This test was performed using the APTIMA COMBO2 Assay ([employer]   The analytical performance characteristics of this assay, when used to test SurePath specimens have been determined by [facility]      GC Probe RNA NOT DETECTED     Comment:                    **Normal Reference Range: NOT DETECTED**   This test was performed using the APTIMA COMBO2 Assay ([employer]   The analytical performance characteristics of this assay, when used to test SurePath specimens have been determined by [facility]     POCT urine pregnancy     Status: None   Collection Time: 03/02/16  5:26 PM  Result Value Ref Range   Preg Test, Ur Negative Negative  POCT Wet + KOH Prep     Status: Abnormal   Collection Time: 03/02/16  5:32 PM  Result Value Ref Range   Yeast by KOH Present Present, Absent   Yeast by wet prep Present Present, Absent   WBC by wet prep Few None, Few, Too numerous to count   Clue Cells Wet Prep HPF POC Moderate (A) None, Too numerous to count   Trich by wet prep Absent Present, Absent    Bacteria Wet Prep HPF POC Many (A) None, Few, Too numerous to count   Epithelial Cells By [employer] ([facility]) None None, Few, Too numerous to count   RBC,UR,HPF,POC None None RBC/hpf    No results found.  ASSESSMENT AND PLAN  Berkli was seen today for vaginitis and vaginal discharge.  Diagnoses and all orders for this visit:  Vaginal discharge -     POCT Wet + KOH Prep -     POCT urine pregnancy  Screening for STD (sexually transmitted disease) -     HIV antibody -     RPR -     GC/Chlamydia Probe Amp  BV (bacterial vaginosis): Resulted from self collect wet prep.  Will treat for yeast and BV.  STD labs out.  -     metroNIDAZOLE (FLAGYL) 500 MG tablet; Take 1 tablet (500 mg total) by mouth 2 (two) times daily. Take with meals. DO NOT CONSUME ALCOHOL WHILE TAKING THIS MEDICATION.  Yeast vaginitis -     fluconazole (DIFLUCAN) 150 MG tablet; Take 1 tablet (150 mg total) by mouth once. Repeat if needed  Other orders -     Cancel: POCT Microscopic Urinalysis ([facility]) -     Cancel: POCT urinalysis dipstick     The patient is advised to call or return to clinic if she does not see an improvement in symptoms, or to seek the care of the closest emergency department if she worsens with the above plan.   This note was scribed in my presence and I performed the services described in the this documentation.   Philis Fendt, MHS, PA-C [facility] 03/03/2016 1:24 PM

## 2016-03-02 NOTE — Patient Instructions (Signed)
     IF you received an x-ray today, you will receive an invoice from [facility]. Please contact [facility] at [phone] with questions or concerns regarding your invoice.   IF you received labwork today, you will receive an invoice from [facility]. Please contact [facility] at [phone] with questions or concerns regarding your invoice.   Our billing staff will not be able to assist you with questions regarding bills from these companies.  You will be contacted with the lab results as soon as they are available. The fastest way to get your results is to activate your My Chart account. Instructions are located on the last page of this paperwork. If you have not heard from us regarding the results in 2 weeks, please contact this office.      

## 2016-03-03 LAB — HIV ANTIBODY (ROUTINE TESTING W REFLEX): HIV: NONREACTIVE

## 2016-03-03 LAB — GC/CHLAMYDIA PROBE AMP
CT PROBE, AMP APTIMA: NOT DETECTED
GC Probe RNA: NOT DETECTED

## 2016-03-04 LAB — RPR

## 2016-03-05 DIAGNOSIS — R87612 Low grade squamous intraepithelial lesion on cytologic smear of cervix (LGSIL): Secondary | ICD-10-CM | POA: Diagnosis not present

## 2016-04-20 DIAGNOSIS — N760 Acute vaginitis: Secondary | ICD-10-CM | POA: Diagnosis not present

## 2016-08-31 DIAGNOSIS — L309 Dermatitis, unspecified: Secondary | ICD-10-CM | POA: Diagnosis not present

## 2016-08-31 DIAGNOSIS — Z411 Encounter for cosmetic surgery: Secondary | ICD-10-CM | POA: Diagnosis not present

## 2016-08-31 DIAGNOSIS — L810 Postinflammatory hyperpigmentation: Secondary | ICD-10-CM | POA: Diagnosis not present

## 2016-10-26 DIAGNOSIS — R87612 Low grade squamous intraepithelial lesion on cytologic smear of cervix (LGSIL): Secondary | ICD-10-CM | POA: Diagnosis not present

## 2016-11-13 ENCOUNTER — Ambulatory Visit (INDEPENDENT_AMBULATORY_CARE_PROVIDER_SITE_OTHER): Payer: BLUE CROSS/BLUE SHIELD | Admitting: Physician Assistant

## 2016-11-13 ENCOUNTER — Encounter: Payer: Self-pay | Admitting: Physician Assistant

## 2016-11-13 VITALS — BP 118/79 | HR 84 | Temp 98.2°F | Resp 18 | Ht 66.5 in | Wt 137.0 lb

## 2016-11-13 DIAGNOSIS — J019 Acute sinusitis, unspecified: Secondary | ICD-10-CM | POA: Diagnosis not present

## 2016-11-13 MED ORDER — HYDROCODONE-HOMATROPINE 5-1.5 MG/5ML PO SYRP: 5.0000 mL | ORAL_SOLUTION | Freq: Three times a day (TID) | ORAL | 0 refills | Status: DC | PRN

## 2016-11-13 MED ORDER — GUAIFENESIN ER 1200 MG PO TB12: 1.0000 | ORAL_TABLET | Freq: Two times a day (BID) | ORAL | 0 refills | Status: AC

## 2016-11-13 MED ORDER — AMOXICILLIN 875 MG PO TABS: 875.0000 mg | ORAL_TABLET | Freq: Two times a day (BID) | ORAL | 0 refills | Status: DC

## 2016-11-13 NOTE — Progress Notes (Signed)
   Stacey Morrow  MRN: [id number] DOB: Sep 10, 1985  PCP: Patient, No Pcp Per  Chief Complaint  Patient presents with  . Cough    x4weeks dry cough and all night greenish mucus   . Adenopathy  . nasal drainage    Subjective:  Pt presents to clinic for cold symptoms that started about 3 weeks ago - she was treating herself at home - but she is not getting better.  She has green sputum that she is coughing up but she is not sure if it is from her throat or her lungs - she has PND.  She wakes up in the am with a sore throat that gets better as the day goes on.  She use use from OTC allergy treatment.  Review of Systems  Constitutional: Negative for chills and fever.  HENT: Positive for congestion, postnasal drip and sore throat. Negative for rhinorrhea.   Respiratory: Positive for cough. Negative for shortness of breath and wheezing.        No h/o asthma, nonsmoker  Gastrointestinal: Negative.   Neurological: Positive for headaches (not a change for her).  Psychiatric/Behavioral: Positive for sleep disturbance (2nd to cough).    There are no active problems to display for this patient.   No current outpatient prescriptions on file prior to visit.   No current facility-administered medications on file prior to visit.     No Known Allergies  Pt patients past, family and social history were reviewed and updated.   Objective:  BP 118/79   Pulse 84   Temp 98.2 F (36.8 C) (Oral)   Resp 18   Ht 5' 6.5" (1.689 m)   Wt 137 lb (62.1 kg)   LMP 10/06/2016   SpO2 100%   BMI 21.78 kg/m   Physical Exam  Constitutional: She is oriented to person, place, and time and well-developed, well-nourished, and in no distress.  HENT:  Head: Normocephalic and atraumatic.  Right Ear: Hearing, tympanic membrane, external ear and ear canal normal.  Left Ear: Hearing, tympanic membrane, external ear and ear canal normal.  Nose: Mucosal edema (red) present. Right sinus exhibits no maxillary  sinus tenderness and no frontal sinus tenderness. Left sinus exhibits no maxillary sinus tenderness and no frontal sinus tenderness.  Mouth/Throat: Uvula is midline, oropharynx is clear and moist and mucous membranes are normal.  Eyes: Conjunctivae are normal.  Neck: Normal range of motion.  Cardiovascular: Normal rate, regular rhythm and normal heart sounds.   No murmur heard. Pulmonary/Chest: Effort normal and breath sounds normal.  Neurological: She is alert and oriented to person, place, and time. Gait normal.  Skin: Skin is warm and dry.  Psychiatric: Mood, memory, affect and judgment normal.  Vitals reviewed.   Assessment and Plan :  Acute non-recurrent sinusitis, unspecified location - Plan: amoxicillin (AMOXIL) 875 MG tablet, Guaifenesin (MUCINEX MAXIMUM STRENGTH) 1200 MG TB12, HYDROcodone-homatropine (HYCODAN) 5-1.5 MG/5ML syrup  Due to length of time and symptoms except her cough to be from PND from sinus infection.  Symptomatic care d/w pt.  Windell Hummingbird PA-C  [facility] 11/13/2016 12:37 PM

## 2016-11-13 NOTE — Patient Instructions (Addendum)
  Please push fluids.  Tylenol and Motrin for fever and body aches.      Nasal saline spray can be helpful to keep the mucus membranes moist and thin the nasal mucus    IF you received an x-ray today, you will receive an invoice from [facility]. Please contact [facility] at [phone] with questions or concerns regarding your invoice.   IF you received labwork today, you will receive an invoice from [facility]. Please contact [facility] at [phone] with questions or concerns regarding your invoice.   Our billing staff will not be able to assist you with questions regarding bills from these companies.  You will be contacted with the lab results as soon as they are available. The fastest way to get your results is to activate your My Chart account. Instructions are located on the last page of this paperwork. If you have not heard from us regarding the results in 2 weeks, please contact this office.     

## 2017-03-10 IMAGING — CR DG CHEST 1V
1 series · 1 of 1 positions shown · non-contrast
Comparison: None.

CLINICAL DATA: Pre employment chest x-ray.

EXAM:
CHEST 1 VIEW

[view not recorded]
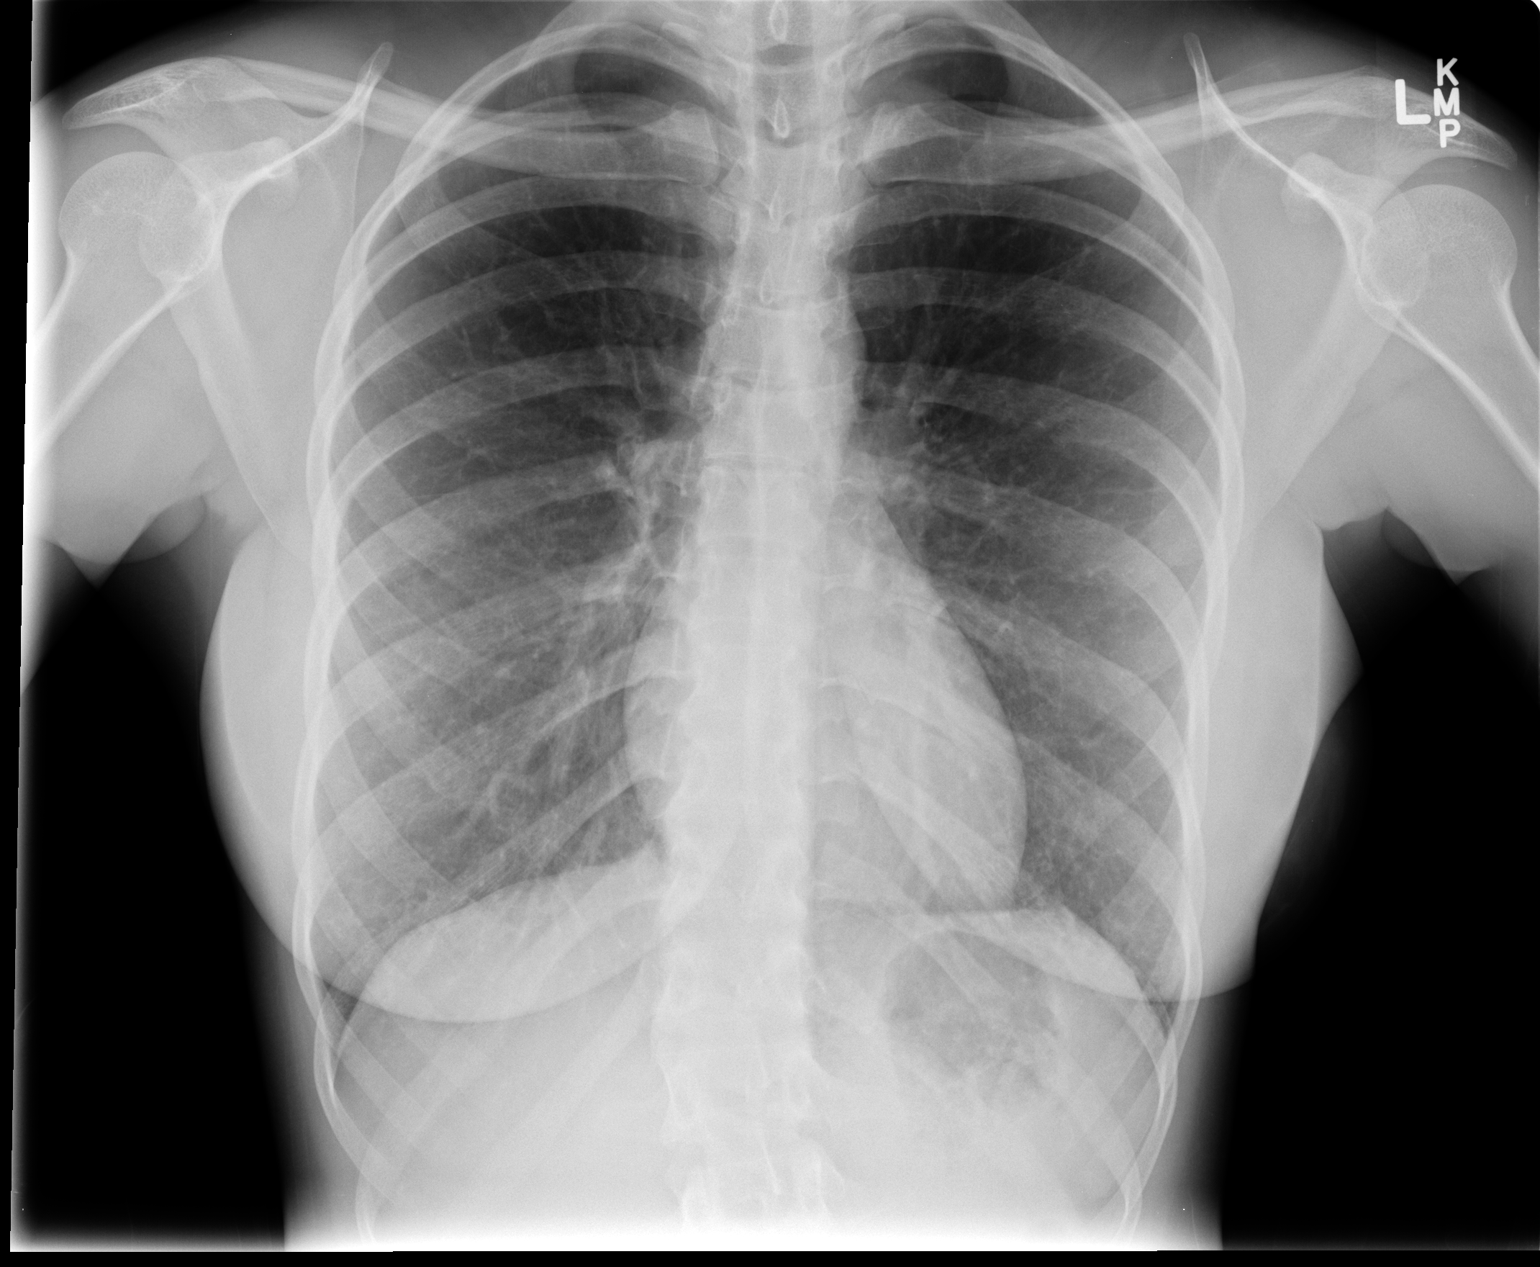

[1 of 1 positions shown; findings below may reference images not displayed]

FINDINGS: Heart size and pulmonary vascularity are normal. The lungs are
clear. Slight thoracolumbar scoliosis.
IMPRESSION: Normal except for slight scoliosis.

## 2017-05-14 ENCOUNTER — Other Ambulatory Visit: Payer: Self-pay

## 2017-05-14 ENCOUNTER — Encounter (HOSPITAL_COMMUNITY): Payer: Self-pay | Admitting: Emergency Medicine

## 2017-05-14 ENCOUNTER — Ambulatory Visit (HOSPITAL_COMMUNITY)
Admission: EM | Admit: 2017-05-14 | Discharge: 2017-05-14 | Disposition: A | Discharge status: Home / Self Care | Payer: BLUE CROSS/BLUE SHIELD | Attending: Family Medicine | Admitting: Family Medicine

## 2017-05-14 DIAGNOSIS — R51 Headache: Secondary | ICD-10-CM | POA: Diagnosis not present

## 2017-05-14 DIAGNOSIS — Z3202 Encounter for pregnancy test, result negative: Secondary | ICD-10-CM | POA: Diagnosis not present

## 2017-05-14 DIAGNOSIS — R519 Headache, unspecified: Secondary | ICD-10-CM

## 2017-05-14 LAB — POCT PREGNANCY, URINE: Preg Test, Ur: NEGATIVE

## 2017-05-14 MED ORDER — DEXAMETHASONE SODIUM PHOSPHATE 10 MG/ML IJ SOLN
10.0000 mg | Freq: Once | INTRAMUSCULAR | Status: AC
  Administered 2017-05-14: 10 mg via INTRAMUSCULAR

## 2017-05-14 MED ORDER — DEXAMETHASONE SODIUM PHOSPHATE 10 MG/ML IJ SOLN
INTRAMUSCULAR | Status: AC
  Filled 2017-05-14: qty 1

## 2017-05-14 MED ORDER — KETOROLAC TROMETHAMINE 30 MG/ML IJ SOLN
INTRAMUSCULAR | Status: AC
  Filled 2017-05-14: qty 1

## 2017-05-14 MED ORDER — KETOROLAC TROMETHAMINE 30 MG/ML IJ SOLN
30.0000 mg | Freq: Once | INTRAMUSCULAR | Status: AC
  Administered 2017-05-14: 30 mg via INTRAMUSCULAR

## 2017-05-14 MED ORDER — ONDANSETRON 4 MG PO TBDP
ORAL_TABLET | ORAL | Status: AC
  Filled 2017-05-14: qty 1

## 2017-05-14 MED ORDER — ONDANSETRON 4 MG PO TBDP
4.0000 mg | ORAL_TABLET | Freq: Once | ORAL | Status: AC
  Administered 2017-05-14: 4 mg via ORAL

## 2017-05-14 NOTE — ED Provider Notes (Signed)
[facility]    CSN: [id number] Arrival date & time: 05/14/17  1853     History   Chief Complaint Chief Complaint  Patient presents with  . Headache    HPI Stacey Morrow is a 32 y.o. female.   Stacey Morrow presents with complaints of bad headache which started at 1430 today. She states she tends to get headaches but this one feels more severe than usual for her. Pain is throbbing, rates it a 6/10. Movement makes it worse. Denies vision change, fevers, light or sound sensitivity, head or neck injury. She is on birth control, took nuvaring out today but has not yet started her period. LMP 12/4. Without vomiting, weakness or dizziness.she took [facility] powder which significantly helped with the pain.  ROS per HPI.       Past Medical History:  Diagnosis Date  . Eczema     There are no active problems to display for this patient.   Past Surgical History:  Procedure Laterality Date  . ANTERIOR CRUCIATE LIGAMENT REPAIR  2006   in [place], [place]; hurdler in high school    OB History    Gravida Para Term Preterm AB Living   0 0 0 0 0 0   SAB TAB Ectopic Multiple Live Births   0 0 0 0         Home Medications    Prior to Admission medications   Medication Sig Start Date End Date Taking? Authorizing Provider  amoxicillin (AMOXIL) 875 MG tablet Take 1 tablet (875 mg total) by mouth 2 (two) times daily. Patient not taking: Reported on 05/14/2017 11/13/16   Gale Journey, Damaris Hippo, PA-C  HYDROcodone-homatropine [facility]) 5-1.5 MG/5ML syrup Take 5 mLs by mouth every 8 (eight) hours as needed for cough. Patient not taking: Reported on 05/14/2017 11/13/16   Mancel Bale, PA-C  triamcinolone ointment (KENALOG) 0.1 % apply to affected area twice a day 09/09/16   [provider]    Family History Family History  Problem Relation Age of Onset  . Hypertension Mother   . Hypertension Father     Social History Social History   Tobacco Use  . Smoking status: Never Smoker  .  Smokeless tobacco: Never Used  Substance Use Topics  . Alcohol use: Yes    Comment: occasionally  . Drug use: No     Allergies   Patient has no known allergies.   Review of Systems Review of Systems   Physical Exam Triage Vital Signs ED Triage Vitals  Enc Vitals Group     BP 05/14/17 1916 (!) 155/110     Pulse Rate 05/14/17 1916 86     Resp 05/14/17 1916 16     Temp 05/14/17 1916 98.2 F (36.8 C)     Temp src --      SpO2 05/14/17 1916 100 %     Weight --      Height --      Head Circumference --      Peak Flow --      Pain Score 05/14/17 1917 6     Pain Loc --      Pain Edu? --      Excl. in [place]? --    No data found.  Updated Vital Signs BP (!) 155/110   Pulse 86   Temp 98.2 F (36.8 C)   Resp 16   SpO2 100%   Visual Acuity Right Eye Distance:   Left Eye Distance:   Bilateral Distance:  Right Eye Near:   Left Eye Near:    Bilateral Near:     Physical Exam  Constitutional: She is oriented to person, place, and time. She appears well-developed and well-nourished. No distress.  Eyes: EOM are normal. Pupils are equal, round, and reactive to light.  Cardiovascular: Normal rate, regular rhythm and normal heart sounds.  Pulmonary/Chest: Effort normal and breath sounds normal.  Neurological: She is alert and oriented to person, place, and time. She has normal strength. She is not disoriented. No cranial nerve deficit or sensory deficit. Gait normal. GCS eye subscore is 4. GCS verbal subscore is 5. GCS motor subscore is 6.  Skin: Skin is warm and dry.  Psychiatric: She has a normal mood and affect.     UC Treatments / Results  Labs (all labs ordered are listed, but only abnormal results are displayed) Labs Reviewed  POCT PREGNANCY, URINE    EKG  EKG Interpretation None       Radiology No results found.  Procedures Procedures (including critical care time)  Medications Ordered in UC Medications  dexamethasone (DECADRON) injection 10 mg  (10 mg Intramuscular Given 05/14/17 1940)  ketorolac (TORADOL) 30 MG/ML injection 30 mg (30 mg Intramuscular Given 05/14/17 1940)  ondansetron (ZOFRAN-ODT) disintegrating tablet 4 mg (4 mg Oral Given 05/14/17 1940)     Initial Impression / Assessment and Plan / UC Course  I have reviewed the triage vital signs and the nursing notes.  Pertinent labs & imaging results that were available during my care of the patient were reviewed by me and considered in my medical decision making (see chart for details).  Clinical Course as of May 14 2018  Sat May 14, 2017  2019 144/100 BP Pain 2/10   [NB]    Clinical Course User Index [NB] Augusto Gamble B, NP    Pain almost completely gone after torodal, zofran and decadron. Normal neurological exam. Patient states she feels significantly improved. Return precautions provided. Patient verbalized understanding and agreeable to plan.  Ambulatory out of clinic without difficulty.    Final Clinical Impressions(s) / UC Diagnoses   Final diagnoses:  Acute nonintractable headache, unspecified headache type    ED Discharge Orders    None       Controlled Substance Prescriptions [place] Controlled Substance Registry consulted? Not Applicable   Zigmund Gottron, NP 05/14/17 2022

## 2017-05-14 NOTE — Discharge Instructions (Signed)
Drink plenty of water. Rest. Do not take additional ibuprofen or BC powder tonight, may take tomorrow if need be. May take additional tylenol tonight if still with mild pain.  If develop increased pain, dizziness, weakness, vision changes or otherwise worsening return or visit Ed.

## 2017-05-14 NOTE — ED Triage Notes (Signed)
Pt c/o oingoing headache x4 hours, took some BC powder for it.

## 2017-05-16 DIAGNOSIS — G43909 Migraine, unspecified, not intractable, without status migrainosus: Secondary | ICD-10-CM | POA: Diagnosis not present

## 2017-05-16 DIAGNOSIS — R030 Elevated blood-pressure reading, without diagnosis of hypertension: Secondary | ICD-10-CM | POA: Diagnosis not present

## 2017-05-26 DIAGNOSIS — R51 Headache: Secondary | ICD-10-CM | POA: Diagnosis not present

## 2017-05-26 DIAGNOSIS — D509 Iron deficiency anemia, unspecified: Secondary | ICD-10-CM | POA: Diagnosis not present

## 2017-06-20 DIAGNOSIS — L309 Dermatitis, unspecified: Secondary | ICD-10-CM | POA: Diagnosis not present

## 2017-07-12 DIAGNOSIS — Z01419 Encounter for gynecological examination (general) (routine) without abnormal findings: Secondary | ICD-10-CM | POA: Diagnosis not present

## 2017-07-12 DIAGNOSIS — Z6821 Body mass index (BMI) 21.0-21.9, adult: Secondary | ICD-10-CM | POA: Diagnosis not present

## 2017-07-21 ENCOUNTER — Encounter: Payer: Self-pay | Admitting: *Deleted

## 2017-07-21 ENCOUNTER — Ambulatory Visit (INDEPENDENT_AMBULATORY_CARE_PROVIDER_SITE_OTHER): Payer: BLUE CROSS/BLUE SHIELD | Admitting: Neurology

## 2017-07-21 ENCOUNTER — Encounter (INDEPENDENT_AMBULATORY_CARE_PROVIDER_SITE_OTHER): Payer: Self-pay

## 2017-07-21 ENCOUNTER — Encounter: Payer: Self-pay | Admitting: Neurology

## 2017-07-21 VITALS — BP 113/76 | HR 71 | Ht 65.0 in | Wt 136.8 lb

## 2017-07-21 DIAGNOSIS — G43709 Chronic migraine without aura, not intractable, without status migrainosus: Secondary | ICD-10-CM

## 2017-07-21 DIAGNOSIS — R51 Headache with orthostatic component, not elsewhere classified: Secondary | ICD-10-CM

## 2017-07-21 DIAGNOSIS — G43009 Migraine without aura, not intractable, without status migrainosus: Secondary | ICD-10-CM

## 2017-07-21 DIAGNOSIS — R519 Headache, unspecified: Secondary | ICD-10-CM

## 2017-07-21 MED ORDER — SUMATRIPTAN SUCCINATE 100 MG PO TABS: 100.0000 mg | ORAL_TABLET | Freq: Once | ORAL | 12 refills | Status: AC | PRN

## 2017-07-21 NOTE — Progress Notes (Signed)
[facility]    Provider:  Dr Jaynee Eagles Referring Provider: Seward Carol, MD Primary Care Physician:  Seward Carol, MD  CC:  Migraine  HPI:  Stacey Morrow is a 32 y.o. female here as a referral from Dr. Delfina Redwood for headache. She has been having headaches for years. She is improved since not working third shift and sleeping better. She had a severe headache a few months ago and went to urgent care, it was severe, in the setting of no food and working out, was an 8/10 and she was crying. Shots at urgent care relieved it. Last one was last Wednesday and can last 24 hours if not treated. Excedrin helps. She was taking BC powders 1 a week and stopped that,Migraines have more frequent in the last few months, usually 3 days a month of migraine. The migraine is pulsating on the right side of her head, nausea, no vomiting, lights make it worse and a dark room helps. No known triggers. Dad has migraines. She has had aura in the past but not recently. No change in quality, but increased frequency and positional/occipital. No other focal neurologic deficits, associated symptoms, inciting events or modifiable factors. No blurry vision. No morning headaches. No hearing changes. No other focal neurologic deficits, associated symptoms, inciting events or modifiable factors.  Reviewed notes, labs and imaging from outside physicians, which showed:  Reviewed referring physician notes.  She is on NuvaRing.  She has a past medical history of anemia and constipation.  She never smoked.  Also past medical history of migraine without status migrainosus not intractable.  CMP and TSH were drawn and she was asked to stop the [facility] powder.  CMP unremarkable  Review of Systems: Patient complains of symptoms per HPI as well as the following symptoms: headache. Pertinent negatives and positives per HPI. All others negative.   Social History   Socioeconomic History  . Marital status: Single    Spouse name:  n/a  . Number of children: 0  . Years of education: 28  . Highest education level: Not on file  Social Needs  . Financial resource strain: Not on file  . Food insecurity - worry: Not on file  . Food insecurity - inability: Not on file  . Transportation needs - medical: Not on file  . Transportation needs - non-medical: Not on file  Occupational History  . Occupation: Programmer, applications: [employer]  Tobacco Use  . Smoking status: Current Some Day Smoker  . Smokeless tobacco: Never Used  . Tobacco comment: hookah occasional  Substance and Sexual Activity  . Alcohol use: Yes    Comment: social  . Drug use: No  . Sexual activity: Yes    Partners: Male    Birth control/protection: Condom    Comment: inconsistent  Other Topics Concern  . Not on file  Social History Narrative   Graduated from [employer] with a degree in Chemistry.   Left handed   Lives at home alone    Drinks 1 cup of coffee daily at the most. Has decreased her use.     Family History  Problem Relation Age of Onset  . Hypertension Mother   . Hypertension Father     Past Medical History:  Diagnosis Date  . Constipation   . Eczema   . Headache   . Iron deficiency anemia     Past Surgical History:  Procedure Laterality Date  . ANTERIOR CRUCIATE LIGAMENT REPAIR  2006   in  [place], [place]; hurdler in high school    Current Outpatient Medications  Medication Sig Dispense Refill  . TRIANEX 0.05 % OINT Apply topically 2 (two) times daily as needed. For 14 days  3  . SUMAtriptan (IMITREX) 100 MG tablet Take 1 tablet (100 mg total) by mouth once as needed for up to 1 dose. May repeat in 2 hours if headache persists or recurs. 10 tablet 12   No current facility-administered medications for this visit.     Allergies as of 07/21/2017  . (No Known Allergies)    Vitals: BP 113/76 (BP Location: Right Arm, Patient Position: Sitting)   Pulse 71   Ht 5\' 5"  (1.651 m)   Wt 136 lb 12.8 oz (62.1 kg)    BMI 22.76 kg/m  Last Weight:  Wt Readings from Last 1 Encounters:  07/21/17 136 lb 12.8 oz (62.1 kg)   Last Height:   Ht Readings from Last 1 Encounters:  07/21/17 5\' 5"  (1.651 m)    Physical exam: Exam: Gen: NAD, conversant, well nourised, well groomed                     CV: RRR, no MRG. No Carotid Bruits. No peripheral edema, warm, nontender Eyes: Conjunctivae clear without exudates or hemorrhage  Neuro: Detailed Neurologic Exam  Speech:    Speech is normal; fluent and spontaneous with normal comprehension.  Cognition:    The patient is oriented to person, place, and time;     recent and remote memory intact;     language fluent;     normal attention, concentration,     fund of knowledge Cranial Nerves:    The pupils are equal, round, and reactive to light. The fundi are normal and spontaneous venous pulsations are present. Visual fields are full to finger confrontation. Extraocular movements are intact. Trigeminal sensation is intact and the muscles of mastication are normal. The face is symmetric. The palate elevates in the midline. Hearing intact. Voice is normal. Shoulder shrug is normal. The tongue has normal motion without fasciculations.   Coordination:    Normal finger to nose and heel to shin. Normal rapid alternating movements.   Gait:    Heel-toe and tandem gait are normal.   Motor Observation:    No asymmetry, no atrophy, and no involuntary movements noted. Tone:    Normal muscle tone.    Posture:    Posture is normal. normal erect    Strength:    Strength is V/V in the upper and lower limbs.      Sensation: intact to LT     Reflex Exam:  DTR's:    Deep tendon reflexes in the upper and lower extremities are normal bilaterally.   Toes:    The toes are downgoing bilaterally.   Clonus:    Clonus is absent.      Assessment/Plan:  32 year old with hx of migraines.   MRI of the brain w/wo contrast due to increased frequency and concerning  symptoms of positional and occipital to evaluate for space-occupying masses, chiari, intracranial HTN, or other intracranial lesions or etiologies.    Orders Placed This Encounter  Procedures  . MR BRAIN W WO CONTRAST    Imitrex (Sumatriptan): Please take one tablet at the onset of your headache. If it does not improve the symptoms please take one additional tablet in 2 hours. Do not take more then 2 tablets in 24hrs. Do not take use more then 2 to 3  times in a week.  Discussed: To prevent or relieve headaches, try the following: Cool Compress. Lie down and place a cool compress on your head.  Avoid headache triggers. If certain foods or odors seem to have triggered your migraines in the past, avoid them. A headache diary might help you identify triggers.  Include physical activity in your daily routine. Try a daily walk or other moderate aerobic exercise.  Manage stress. Find healthy ways to cope with the stressors, such as delegating tasks on your to-do list.  Practice relaxation techniques. Try deep breathing, yoga, massage and visualization.  Eat regularly. Eating regularly scheduled meals and maintaining a healthy diet might help prevent headaches. Also, drink plenty of fluids.  Follow a regular sleep schedule. Sleep deprivation might contribute to headaches Consider biofeedback. With this mind-body technique, you learn to control certain bodily functions - such as muscle tension, heart rate and blood pressure - to prevent headaches or reduce headache pain.    Proceed to emergency room if you experience new or worsening symptoms or symptoms do not resolve, if you have new neurologic symptoms or if headache is severe, or for any concerning symptom.   Provided education and documentation from American headache Society toolbox including articles on: chronic migraine medication overuse headache, chronic migraines, prevention of migraines, behavioral and other nonpharmacologic treatments for  headache.    Sarina Ill, MD  [facility] [address] [place], [place] [address]  Phone [phone] Fax [phone]

## 2017-07-21 NOTE — Patient Instructions (Signed)
MRI brain w/wo contrast Imitrex (Sumatriptan): Please take one tablet at the onset of your headache. If it does not improve the symptoms please take one additional tablet in 2 hours. Do not take more then 2 tablets in 24hrs. Do not take use more then 2 to 3 times in a week.  Sumatriptan tablets What is this medicine? SUMATRIPTAN (soo ma TRIP tan) is used to treat migraines with or without aura. An aura is a strange feeling or visual disturbance that warns you of an attack. It is not used to prevent migraines. This medicine may be used for other purposes; ask your health care provider or pharmacist if you have questions. COMMON BRAND NAME(S): Imitrex, Migraine Pack What should I tell my health care provider before I take this medicine? They need to know if you have any of these conditions: -circulation problems in fingers and toes -diabetes -heart disease -high blood pressure -high cholesterol -history of irregular heartbeat -history of stroke -kidney disease -liver disease -postmenopausal or surgical removal of uterus and ovaries -seizures -smoke tobacco -stomach or intestine problems -an unusual or allergic reaction to sumatriptan, other medicines, foods, dyes, or preservatives -pregnant or trying to get pregnant -breast-feeding How should I use this medicine? Take this medicine by mouth with a glass of water. Follow the directions on the prescription label. This medicine is taken at the first symptoms of a migraine. It is not for everyday use. If your migraine headache returns after one dose, you can take another dose as directed. You must leave at least 2 hours between doses, and do not take more than 100 mg as a single dose. Do not take more than 200 mg total in any 24 hour period. If there is no improvement at all after the first dose, do not take a second dose without talking to your doctor or health care professional. Do not take your medicine more often than directed. Talk to your  pediatrician regarding the use of this medicine in children. Special care may be needed. Overdosage: If you think you have taken too much of this medicine contact a poison control center or emergency room at once. NOTE: This medicine is only for you. Do not share this medicine with others. What if I miss a dose? This does not apply; this medicine is not for regular use. What may interact with this medicine? Do not take this medicine with any of the following medicines: -cocaine -ergot alkaloids like dihydroergotamine, ergonovine, ergotamine, methylergonovine -feverfew -MAOIs like Carbex, Eldepryl, Marplan, Nardil, and Parnate -other medicines for migraine headache like almotriptan, eletriptan, frovatriptan, naratriptan, rizatriptan, zolmitriptan -tryptophan This medicine may also interact with the following medications: -certain medicines for depression, anxiety, or psychotic disturbances This list may not describe all possible interactions. Give your health care provider a list of all the medicines, herbs, non-prescription drugs, or dietary supplements you use. Also tell them if you smoke, drink alcohol, or use illegal drugs. Some items may interact with your medicine. What should I watch for while using this medicine? Only take this medicine for a migraine headache. Take it if you get warning symptoms or at the start of a migraine attack. It is not for regular use to prevent migraine attacks. You may get drowsy or dizzy. Do not drive, use machinery, or do anything that needs mental alertness until you know how this medicine affects you. To reduce dizzy or fainting spells, do not sit or stand up quickly, especially if you are an older patient. Alcohol can  increase drowsiness, dizziness and flushing. Avoid alcoholic drinks. Smoking cigarettes may increase the risk of heart-related side effects from using this medicine. If you take migraine medicines for 10 or more days a month, your migraines may  get worse. Keep a diary of headache days and medicine use. Contact your healthcare professional if your migraine attacks occur more frequently. What side effects may I notice from receiving this medicine? Side effects that you should report to your doctor or health care professional as soon as possible: -allergic reactions like skin rash, itching or hives, swelling of the face, lips, or tongue -bloody or watery diarrhea -hallucination, loss of contact with reality -pain, tingling, numbness in the face, hands, or feet -seizures -signs and symptoms of a blood clot such as breathing problems; changes in vision; chest pain; severe, sudden headache; pain, swelling, warmth in the leg; trouble speaking; sudden numbness or weakness of the face, arm, or leg -signs and symptoms of a dangerous change in heartbeat or heart rhythm like chest pain; dizziness; fast or irregular heartbeat; palpitations, feeling faint or lightheaded; falls; breathing problems -signs and symptoms of a stroke like changes in vision; confusion; trouble speaking or understanding; severe headaches; sudden numbness or weakness of the face, arm, or leg; trouble walking; dizziness; loss of balance or coordination -stomach pain Side effects that usually do not require medical attention (report to your doctor or health care professional if they continue or are bothersome): -changes in taste -facial flushing -headache -muscle cramps -muscle pain -nausea, vomiting -weak or tired This list may not describe all possible side effects. Call your doctor for medical advice about side effects. You may report side effects to FDA at 1-800-FDA-1088. Where should I keep my medicine? Keep out of the reach of children. Store at room temperature between 2 and 30 degrees C (36 and 86 degrees F). Throw away any unused medicine after the expiration date. NOTE: This sheet is a summary. It may not cover all possible information. If you have questions about  this medicine, talk to your doctor, pharmacist, or health care provider.  2018 Elsevier/Gold Standard (2015-05-29 12:38:23)

## 2017-07-24 DIAGNOSIS — G43709 Chronic migraine without aura, not intractable, without status migrainosus: Secondary | ICD-10-CM | POA: Insufficient documentation

## 2017-07-25 ENCOUNTER — Telehealth: Payer: Self-pay | Admitting: Neurology

## 2017-07-25 NOTE — Telephone Encounter (Signed)
[employer] Auth: [place] Ref # [id number]. I spoke to the patient I informed her it would be about $496.28 on her part. She stated she will talk to her parents and get back to me.

## 2017-07-25 NOTE — Telephone Encounter (Signed)
And I did offer payment plan.

## 2017-08-15 ENCOUNTER — Telehealth: Payer: Self-pay | Admitting: Neurology

## 2017-08-15 NOTE — Telephone Encounter (Signed)
Pt is scheduled at the [facility] mobile unit for 08/23/17. BCBS AUth: [place] Ref # [id number].

## 2017-08-23 ENCOUNTER — Ambulatory Visit: Payer: BLUE CROSS/BLUE SHIELD

## 2017-08-23 DIAGNOSIS — R51 Headache with orthostatic component, not elsewhere classified: Secondary | ICD-10-CM

## 2017-08-23 DIAGNOSIS — R519 Headache, unspecified: Secondary | ICD-10-CM

## 2017-08-23 MED ORDER — GADOPENTETATE DIMEGLUMINE 469.01 MG/ML IV SOLN
12.0000 mL | Freq: Once | INTRAVENOUS | Status: AC | PRN
  Administered 2017-08-23: 12 mL via INTRAVENOUS

## 2017-08-24 ENCOUNTER — Telehealth: Payer: Self-pay | Admitting: *Deleted

## 2017-08-24 NOTE — Telephone Encounter (Addendum)
Spoke with patient. She is aware that her MRI of her brain is normal. She had no questions and verbalized appreciation.    ----- Message from Melvenia Beam, MD sent at 08/24/2017  9:51 AM EDT ----- MRI brain normal thanks

## 2017-09-08 DIAGNOSIS — H1045 Other chronic allergic conjunctivitis: Secondary | ICD-10-CM | POA: Diagnosis not present

## 2017-10-07 ENCOUNTER — Other Ambulatory Visit: Payer: Self-pay

## 2017-10-07 ENCOUNTER — Encounter: Payer: Self-pay | Admitting: Physician Assistant

## 2017-10-07 ENCOUNTER — Ambulatory Visit: Payer: BLUE CROSS/BLUE SHIELD | Admitting: Physician Assistant

## 2017-10-07 ENCOUNTER — Ambulatory Visit (INDEPENDENT_AMBULATORY_CARE_PROVIDER_SITE_OTHER): Payer: BLUE CROSS/BLUE SHIELD | Admitting: Physician Assistant

## 2017-10-07 VITALS — BP 104/60 | HR 104 | Temp 101.6°F | Resp 18 | Ht 65.0 in | Wt 136.6 lb

## 2017-10-07 DIAGNOSIS — J36 Peritonsillar abscess: Secondary | ICD-10-CM | POA: Diagnosis not present

## 2017-10-07 DIAGNOSIS — R42 Dizziness and giddiness: Secondary | ICD-10-CM | POA: Diagnosis not present

## 2017-10-07 DIAGNOSIS — F17200 Nicotine dependence, unspecified, uncomplicated: Secondary | ICD-10-CM | POA: Insufficient documentation

## 2017-10-07 DIAGNOSIS — J029 Acute pharyngitis, unspecified: Secondary | ICD-10-CM

## 2017-10-07 LAB — POCT CBC
Granulocyte percent: 84.5 %G — AB (ref 37–80)
HCT, POC: 36.3 % — AB (ref 37.7–47.9)
HEMOGLOBIN: 12.1 g/dL — AB (ref 12.2–16.2)
LYMPH, POC: 1.2 (ref 0.6–3.4)
MCH, POC: 15.8 pg — AB (ref 27–31.2)
MCHC: 33.4 g/dL (ref 31.8–35.4)
MCV: 77.2 fL — AB (ref 80–97)
MID (cbc): 2 — AB (ref 0–0.9)
MPV: 8.9 fL (ref 0–99.8)
PLATELET COUNT, POC: 266 10*3/uL (ref 142–424)
POC Granulocyte: 17.6 — AB (ref 2–6.9)
POC LYMPH PERCENT: 5.9 %L — AB (ref 10–50)
POC MID %: 9.6 % (ref 0–12)
RBC: 4.7 M/uL (ref 4.04–5.48)
RDW, POC: 16 %
WBC: 20.8 10*3/uL — AB (ref 4.6–10.2)

## 2017-10-07 LAB — POCT RAPID STREP A (OFFICE): Rapid Strep A Screen: NEGATIVE

## 2017-10-07 MED ORDER — PREDNISONE 50 MG PO TABS: 50.0000 mg | ORAL_TABLET | Freq: Every day | ORAL | 0 refills | Status: AC

## 2017-10-07 MED ORDER — CEFTRIAXONE SODIUM 1 G IJ SOLR
1.0000 g | Freq: Once | INTRAMUSCULAR | Status: AC
  Administered 2017-10-07: 1 g via INTRAMUSCULAR

## 2017-10-07 MED ORDER — KETOROLAC TROMETHAMINE 60 MG/2ML IM SOLN
60.0000 mg | Freq: Once | INTRAMUSCULAR | Status: AC
  Administered 2017-10-07: 60 mg via INTRAMUSCULAR

## 2017-10-07 MED ORDER — AMOXICILLIN-POT CLAVULANATE 875-125 MG PO TABS: 1.0000 | ORAL_TABLET | Freq: Two times a day (BID) | ORAL | 0 refills | Status: AC

## 2017-10-07 NOTE — Progress Notes (Signed)
10/07/2017 10:32 AM   DOB: 04-08-1986 / MRN: [id number]  SUBJECTIVE:  Stacey Morrow is a 32 y.o. female presenting for neck pain.  Symptoms have been waxing and waning but worse today.  She can manage her secretions.  Fever up to 102 last night.   She has No Known Allergies.   She  has a past medical history of Constipation, Eczema, Headache, and Iron deficiency anemia.    She  reports that she has been smoking.  She has never used smokeless tobacco. She reports that she drinks alcohol. She reports that she does not use drugs. She  reports that she currently engages in sexual activity and has had partners who are Female. She reports using the following method of birth control/protection: Condom. The patient  has a past surgical history that includes Anterior cruciate ligament repair (2006).  Her family history includes Hypertension in her father and mother.  Review of Systems  Constitutional: Positive for chills, fever and malaise/fatigue.  HENT: Positive for sore throat.   Respiratory: Negative for cough, hemoptysis, sputum production, shortness of breath and wheezing.   Gastrointestinal: Negative for abdominal pain and nausea.  Neurological: Positive for dizziness.    The problem list and medications were reviewed and updated by myself where necessary and exist elsewhere in the encounter.   OBJECTIVE:  BP 104/60 (BP Location: Left Arm, Patient Position: Sitting, Cuff Size: Normal)   Pulse (!) 104   Temp (!) 100.6 F (38.1 C)   Resp 18   Ht 5\' 5"  (1.651 m)   Wt 136 lb 9.6 oz (62 kg)   LMP 09/28/2017   SpO2 99%   BMI 22.73 kg/m   Orthostatic VS for the past 24 hrs:  BP- Lying Pulse- Lying BP- Sitting Pulse- Sitting BP- Standing at 0 minutes Pulse- Standing at 0 minutes  10/07/17 0853 129/70 110 129/82 105 130/84 112    Wt Readings from Last 3 Encounters:  10/07/17 136 lb 9.6 oz (62 kg)  07/21/17 136 lb 12.8 oz (62.1 kg)  11/13/16 137 lb (62.1 kg)   Temp Readings from  Last 3 Encounters:  10/07/17 (!) 100.6 F (38.1 C)  05/14/17 98.2 F (36.8 C)  11/13/16 98.2 F (36.8 C) (Oral)   BP Readings from Last 3 Encounters:  10/07/17 104/60  07/21/17 113/76  05/14/17 (!) 155/110   Pulse Readings from Last 3 Encounters:  10/07/17 (!) 104  07/21/17 71  05/14/17 86    Physical Exam  Constitutional: She is oriented to person, place, and time. She appears well-nourished.  Non-toxic appearance. She has a sickly appearance. She appears ill. No distress.  HENT:  Right Ear: Hearing, tympanic membrane, external ear and ear canal normal.  Left Ear: Hearing, tympanic membrane, external ear and ear canal normal.  Nose: Nose normal. Right sinus exhibits no maxillary sinus tenderness and no frontal sinus tenderness. Left sinus exhibits no maxillary sinus tenderness and no frontal sinus tenderness.  Mouth/Throat: Uvula is midline and mucous membranes are normal. Mucous membranes are not dry. Oropharyngeal exudate present. No posterior oropharyngeal edema or tonsillar abscesses.    Eyes: Pupils are equal, round, and reactive to light. EOM are normal.  Cardiovascular: Normal rate, regular rhythm, S1 normal, S2 normal, normal heart sounds and intact distal pulses. Exam reveals no gallop, no friction rub and no decreased pulses.  No murmur heard. Pulmonary/Chest: Effort normal. No stridor. No respiratory distress. She has no wheezes. She has no rales.  Abdominal: She exhibits no distension.  Musculoskeletal: She exhibits no edema.  Lymphadenopathy:       Head (right side): No submandibular and no tonsillar adenopathy present.       Head (left side): No submandibular and no tonsillar adenopathy present.    She has no cervical adenopathy.  Neurological: She is alert and oriented to person, place, and time. She has normal strength and normal reflexes. She is not disoriented. She displays no atrophy. No cranial nerve deficit or sensory deficit. She exhibits normal muscle  tone. Coordination and gait normal.  Skin: Skin is warm and dry. She is not diaphoretic. No pallor.  Psychiatric: She has a normal mood and affect. Her behavior is normal.  Vitals reviewed.   Results for orders placed or performed in visit on 10/07/17 (from the past 72 hour(s))  POCT rapid strep A     Status: None   Collection Time: 10/07/17  8:39 AM  Result Value Ref Range   Rapid Strep A Screen Negative Negative  POCT CBC     Status: Abnormal   Collection Time: 10/07/17  8:52 AM  Result Value Ref Range   WBC 20.8 (A) 4.6 - 10.2 K/uL   Lymph, poc 1.2 0.6 - 3.4   POC LYMPH PERCENT 5.9 (A) 10 - 50 %L   MID (cbc) 2.0 (A) 0 - 0.9   POC MID % 9.6 0 - 12 %M   POC Granulocyte 17.6 (A) 2 - 6.9   Granulocyte percent 84.5 (A) 37 - 80 %G   RBC 4.70 4.04 - 5.48 M/uL   Hemoglobin 12.1 (A) 12.2 - 16.2 g/dL   HCT, POC 36.3 (A) 37.7 - 47.9 %   MCV 77.2 (A) 80 - 97 fL   MCH, POC 15.8 (A) 27 - 31.2 pg   MCHC 33.4 31.8 - 35.4 g/dL   RDW, POC 16.0 %   Platelet Count, POC 266 142 - 424 K/uL   MPV 8.9 0 - 99.8 fL    No results found.  ASSESSMENT AND PLAN:  Aubre was seen today for neck pain.  Diagnoses and all orders for this visit:  Tonsillar abscess Comments: Case discussed with Dr. Mitchel Honour who did evaluate the patient. Appreciate his input. 1 liter NS with IV ceftriaxone.  IM toradol. Meds waiting a pharmacy.   We will see the patient back tomorrow.  ED precautions discussed.  Orders: -     POCT CBC -     cefTRIAXone (ROCEPHIN) injection 1 g -     amoxicillin-clavulanate (AUGMENTIN) 875-125 MG tablet; Take 1 tablet by mouth 2 (two) times daily for 14 days. Take with food. -     predniSONE (DELTASONE) 50 MG tablet; Take 1 tablet (50 mg total) by mouth daily with breakfast for 5 days.  Sore throat -     POCT rapid strep A -     Culture, Group A Strep -     ketorolac (TORADOL) injection 60 mg  Smoker  Dizziness -     Orthostatic vital signs    The patient is advised to call  or return to clinic if she does not see an improvement in symptoms, or to seek the care of the closest emergency department if she worsens with the above plan.   Philis Fendt, MHS, PA-C [facility] 10/07/2017 10:32 AM

## 2017-10-07 NOTE — Patient Instructions (Addendum)
Come back tomorrow. Start all of the medications as soon as you get them from the pharmacy. Okay to add tylenol 1000 mg as needed for breakthrough pain.     IF you received an x-ray today, you will receive an invoice from [facility]. Please contact [facility] at [phone] with questions or concerns regarding your invoice.   IF you received labwork today, you will receive an invoice from [place]. Please contact [facility] at [phone] with questions or concerns regarding your invoice.   Our billing staff will not be able to assist you with questions regarding bills from these companies.  You will be contacted with the lab results as soon as they are available. The fastest way to get your results is to activate your My Chart account. Instructions are located on the last page of this paperwork. If you have not heard from [place] regarding the results in 2 weeks, please contact this office.

## 2017-10-08 ENCOUNTER — Ambulatory Visit (INDEPENDENT_AMBULATORY_CARE_PROVIDER_SITE_OTHER): Payer: BLUE CROSS/BLUE SHIELD | Admitting: Physician Assistant

## 2017-10-08 ENCOUNTER — Encounter: Payer: Self-pay | Admitting: Physician Assistant

## 2017-10-08 VITALS — BP 108/66 | HR 89 | Temp 98.2°F | Ht 66.5 in | Wt 135.0 lb

## 2017-10-08 DIAGNOSIS — Z299 Encounter for prophylactic measures, unspecified: Secondary | ICD-10-CM | POA: Diagnosis not present

## 2017-10-08 DIAGNOSIS — J020 Streptococcal pharyngitis: Secondary | ICD-10-CM

## 2017-10-08 MED ORDER — FLUCONAZOLE 150 MG PO TABS: 150.0000 mg | ORAL_TABLET | Freq: Once | ORAL | 0 refills | Status: AC

## 2017-10-08 NOTE — Progress Notes (Signed)
10/08/2017 8:45 AM   DOB: [birth year] / MRN: [id number]  SUBJECTIVE:  Stacey Morrow is a 32 y.o. female presenting for recheck sore throat.  Culture is growing non-group A strep.  Patient treated with IV ceftriaxone yesterday in the clinic, Toradol, GI cocktail and was sent home on Augmentin, prednisone.  She had a septic picture yesterday however today vitals back to normal.  She has No Known Allergies.   She  has a past medical history of Constipation, Eczema, Headache, and Iron deficiency anemia.    She  reports that she has been smoking.  She has never used smokeless tobacco. She reports that she drinks alcohol. She reports that she does not use drugs. She  reports that she currently engages in sexual activity and has had partners who are Female. She reports using the following method of birth control/protection: Condom. The patient  has a past surgical history that includes Anterior cruciate ligament repair (2006).  Her family history includes Hypertension in her father and mother.  Review of Systems  Constitutional: Negative for chills, diaphoresis and fever.  HENT: Positive for sore throat.   Gastrointestinal: Negative for nausea.  Skin: Negative for rash.  Neurological: Negative for dizziness.    The problem list and medications were reviewed and updated by myself where necessary and exist elsewhere in the encounter.   OBJECTIVE:  BP 108/66 (BP Location: Left Arm, Patient Position: Sitting, Cuff Size: Normal)   Pulse 89   Temp 98.2 F (36.8 C) (Oral)   Ht 5' 6.5" (1.689 m)   Wt 135 lb (61.2 kg)   LMP 09/28/2017   SpO2 100%   BMI 21.46 kg/m   Physical Exam  Constitutional: She is oriented to person, place, and time. She appears well-nourished. No distress.  HENT:  Right Ear: Hearing, tympanic membrane, external ear and ear canal normal.  Left Ear: Hearing, tympanic membrane, external ear and ear canal normal.  Nose: Nose normal. Right sinus exhibits no maxillary sinus  tenderness and no frontal sinus tenderness. Left sinus exhibits no maxillary sinus tenderness and no frontal sinus tenderness.  Mouth/Throat: Uvula is midline, oropharynx is clear and moist and mucous membranes are normal. Mucous membranes are not dry. No oropharyngeal exudate, posterior oropharyngeal edema or tonsillar abscesses.  Eyes: Pupils are equal, round, and reactive to light. EOM are normal.  Cardiovascular: Normal rate, regular rhythm, S1 normal, S2 normal, normal heart sounds and intact distal pulses. Exam reveals no gallop, no friction rub and no decreased pulses.  No murmur heard. Pulmonary/Chest: Effort normal. No stridor. No respiratory distress. She has no wheezes. She has no rales.  Abdominal: She exhibits no distension.  Musculoskeletal: She exhibits no edema.  Lymphadenopathy:       Head (right side): No submandibular and no tonsillar adenopathy present.       Head (left side): No submandibular and no tonsillar adenopathy present.    She has no cervical adenopathy.  Neurological: She is alert and oriented to person, place, and time. No cranial nerve deficit. Gait normal.  Skin: Skin is dry. She is not diaphoretic.  Psychiatric: She has a normal mood and affect.  Vitals reviewed.   Results for orders placed or performed in visit on 10/07/17 (from the past 72 hour(s))  POCT rapid strep A     Status: None   Collection Time: 10/07/17  8:39 AM  Result Value Ref Range   Rapid Strep A Screen Negative Negative  POCT CBC     Status:  Abnormal   Collection Time: 10/07/17  8:52 AM  Result Value Ref Range   WBC 20.8 (A) 4.6 - 10.2 K/uL   Lymph, poc 1.2 0.6 - 3.4   POC LYMPH PERCENT 5.9 (A) 10 - 50 %L   MID (cbc) 2.0 (A) 0 - 0.9   POC MID % 9.6 0 - 12 %M   POC Granulocyte 17.6 (A) 2 - 6.9   Granulocyte percent 84.5 (A) 37 - 80 %G   RBC 4.70 4.04 - 5.48 M/uL   Hemoglobin 12.1 (A) 12.2 - 16.2 g/dL   HCT, POC 36.3 (A) 37.7 - 47.9 %   MCV 77.2 (A) 80 - 97 fL   MCH, POC 15.8 (A)  27 - 31.2 pg   MCHC 33.4 31.8 - 35.4 g/dL   RDW, POC 16.0 %   Platelet Count, POC 266 142 - 424 K/uL   MPV 8.9 0 - 99.8 fL    No results found.  ASSESSMENT AND PLAN:  [name] was seen today for sore thraot.  Diagnoses and all orders for this visit:  Strep pharyngitis: Patient advised to follow-up as needed.  Culture growing strep.  She is to continue antibiotics, steroid, Tylenol.  Patient requesting Diflucan today she gets yeast vaginitis secondary to antibiotic therapy.  Need for prophylactic measure  Other orders -     fluconazole (DIFLUCAN) 150 MG tablet; Take 1 tablet (150 mg total) by mouth once for 1 dose. Repeat if needed    The patient is advised to call or return to clinic if she does not see an improvement in symptoms, or to seek the care of the closest emergency department if she worsens with the above plan.   Philis Fendt, MHS, PA-C [facility] 10/08/2017 8:45 AM

## 2017-10-08 NOTE — Patient Instructions (Addendum)
  Continue your current medications.   Come back at any point that you feel you need to be seen.   IF you received an x-ray today, you will receive an invoice from [facility]. Please contact [facility] at [phone] with questions or concerns regarding your invoice.   IF you received labwork today, you will receive an invoice from [place]. Please contact [facility] at [phone] with questions or concerns regarding your invoice.   Our billing staff will not be able to assist you with questions regarding bills from these companies.  You will be contacted with the lab results as soon as they are available. The fastest way to get your results is to activate your My Chart account. Instructions are located on the last page of this paperwork. If you have not heard from [place] regarding the results in 2 weeks, please contact this office.

## 2017-10-10 ENCOUNTER — Ambulatory Visit: Payer: Self-pay | Admitting: *Deleted

## 2017-10-10 LAB — CULTURE, GROUP A STREP

## 2017-10-10 NOTE — Telephone Encounter (Signed)
Pt started prednisone and amoxicillin fri. and is now having diarrhea. It has happened multiple times. It started sat.  cb is [phone]      Reason for Disposition . Caller has URGENT medication question about med that PCP prescribed and triager unable to answer question  Answer Assessment - Initial Assessment Questions 1. SYMPTOMS: "Do you have any symptoms?"     Frequent diarrhea with antibiotic 2. SEVERITY: If symptoms are present, ask "Are they mild, moderate or severe?"     Severe- patient is having frequent diarrhea- without control or warning.  Protocols used: MEDICATION QUESTION CALL-A-AH  Patient is using medication for strep throat- she is having watery diarrhea. She has not used any OTC medication to try to control the SE- she would like advisement on if she needs to change medication or try to continue. She has already taken todays dose. Best contact: [phone]

## 2017-10-10 NOTE — Telephone Encounter (Signed)
Patient advised Voiced understanding 

## 2017-10-10 NOTE — Telephone Encounter (Signed)
Please advise 

## 2017-10-10 NOTE — Telephone Encounter (Signed)
No change in therapy at this time given patient was nearly septic at initial presentation.  Please advise OTC Imodium or 2 tabs once and then 1 tab after every loose stool max 4 tabs daily.  Return to clinic if bloody diarrhea or abdominal pain.

## 2017-11-14 DIAGNOSIS — Z8249 Family history of ischemic heart disease and other diseases of the circulatory system: Secondary | ICD-10-CM | POA: Diagnosis not present

## 2017-11-14 DIAGNOSIS — Z13220 Encounter for screening for lipoid disorders: Secondary | ICD-10-CM | POA: Diagnosis not present

## 2017-11-14 DIAGNOSIS — Z0000 Encounter for general adult medical examination without abnormal findings: Secondary | ICD-10-CM | POA: Diagnosis not present

## 2018-01-11 ENCOUNTER — Ambulatory Visit (INDEPENDENT_AMBULATORY_CARE_PROVIDER_SITE_OTHER): Payer: BLUE CROSS/BLUE SHIELD | Admitting: Physician Assistant

## 2018-01-11 ENCOUNTER — Other Ambulatory Visit: Payer: Self-pay

## 2018-01-11 ENCOUNTER — Telehealth: Payer: Self-pay | Admitting: Physician Assistant

## 2018-01-11 ENCOUNTER — Encounter: Payer: Self-pay | Admitting: Physician Assistant

## 2018-01-11 VITALS — BP 112/60 | HR 95 | Temp 98.4°F | Resp 20 | Ht 67.21 in | Wt 136.0 lb

## 2018-01-11 DIAGNOSIS — J0100 Acute maxillary sinusitis, unspecified: Secondary | ICD-10-CM | POA: Diagnosis not present

## 2018-01-11 DIAGNOSIS — J029 Acute pharyngitis, unspecified: Secondary | ICD-10-CM | POA: Diagnosis not present

## 2018-01-11 LAB — POCT RAPID STREP A (OFFICE): RAPID STREP A SCREEN: NEGATIVE

## 2018-01-11 MED ORDER — AMOXICILLIN 875 MG PO TABS: 875.0000 mg | ORAL_TABLET | Freq: Two times a day (BID) | ORAL | 0 refills | Status: DC

## 2018-01-11 MED ORDER — DOXYCYCLINE HYCLATE 100 MG PO CAPS: 100.0000 mg | ORAL_CAPSULE | Freq: Two times a day (BID) | ORAL | 0 refills | Status: AC

## 2018-01-11 NOTE — Patient Instructions (Addendum)
Take antibiotics as prescribed.  Also recommend continuing with ibuprofen, Mucinex, cetirizine, and starting daily nasal saline rinses.  He should start to feel better in the next 48 hours.  If any of her symptoms worsen or you develop new concerning symptoms, please seek care immediately. I also  recommend you rest, drink plenty of fluids, eat light meals including soups.  You may also use Tylenol or ibuprofen over-the-counter for your sore throat. Tea recipe for sore throat: boil water, add 2 inches shaved ginger root, steep 15 minutes, add juice from 2 full lemons, and 2 tbsp honey.   Sinusitis, Adult Sinusitis is soreness and inflammation of your sinuses. Sinuses are hollow spaces in the bones around your face. They are located:  Around your eyes.  In the middle of your forehead.  Behind your nose.  In your cheekbones.  Your sinuses and nasal passages are lined with a stringy fluid (mucus). Mucus normally drains out of your sinuses. When your nasal tissues get inflamed or swollen, the mucus can get trapped or blocked so air cannot flow through your sinuses. This lets bacteria, viruses, and funguses grow, and that leads to infection. Follow these instructions at home: Medicines  Take, use, or apply over-the-counter and prescription medicines only as told by your doctor. These may include nasal sprays.  If you were prescribed an antibiotic medicine, take it as told by your doctor. Do not stop taking the antibiotic even if you start to feel better. Hydrate and Humidify  Drink enough water to keep your pee (urine) clear or pale yellow.  Use a cool mist humidifier to keep the humidity level in your home above 50%.  Breathe in steam for 10-15 minutes, 3-4 times a day or as told by your doctor. You can do this in the bathroom while a hot shower is running.  Try not to spend time in cool or dry air. Rest  Rest as much as possible.  Sleep with your head raised (elevated).  Make  sure to get enough sleep each night. General instructions  Put a warm, moist washcloth on your face 3-4 times a day or as told by your doctor. This will help with discomfort.  Wash your hands often with soap and water. If there is no soap and water, use hand sanitizer.  Do not smoke. Avoid being around people who are smoking (secondhand smoke).  Keep all follow-up visits as told by your doctor. This is important. Contact a doctor if:  You have a fever.  Your symptoms get worse.  Your symptoms do not get better within 10 days. Get help right away if:  You have a very bad headache.  You cannot stop throwing up (vomiting).  You have pain or swelling around your face or eyes.  You have trouble seeing.  You feel confused.  Your neck is stiff.  You have trouble breathing. This information is not intended to replace advice given to you by your health care provider. Make sure you discuss any questions you have with your health care provider. Document Released: 10/13/2007 Document Revised: 12/21/2015 Document Reviewed: 02/19/2015 Elsevier Interactive Patient Education  2018 [employer].   IF you received an x-ray today, you will receive an invoice from [facility]. Please contact [facility] at [phone] with questions or concerns regarding your invoice.   IF you received labwork today, you will receive an invoice from [place]. Please contact [facility] at [phone] with questions or concerns regarding your invoice.   Our billing  staff will not be able to assist you with questions regarding bills from these companies.  You will be contacted with the lab results as soon as they are available. The fastest way to get your results is to activate your My Chart account. Instructions are located on the last page of this paperwork. If you have not heard from [place] regarding the results in 2 weeks, please contact this office.

## 2018-01-11 NOTE — Telephone Encounter (Signed)
Copied from [place] [id number]. Topic: Quick Communication - See Telephone Encounter >> Jan 11, 2018 10:47 AM Conception Chancy, NT wrote: CRM for notification. See Telephone encounter for: 01/11/18.  Patient is calling and states she was prescribed amoxicillin (AMOXIL) 875 MG tablet  at todays visit. She states last time it made her have diarrhea and would like to know if there is something else in place of this medication.

## 2018-01-11 NOTE — Progress Notes (Addendum)
MRN: [id number] DOB: 1985-10-12  Subjective:   Stacey Morrow is a 32 y.o. female presenting for chief complaint of Sore Throat (X 2-3 days) and Nasal Congestion (X 2- weeks) .  Reports 2 week history of sinus congestion, sinus pressure, nasal congestion, and post nasal drainage. Dry cough and mildly sore throat that started today. Has tried ceterizine, advil cold and flu, mucinex, and one tablet of left amoxicillin without relief. Felt like she was getting better but then felt worse. Denies fever, ear pain, wheezing, shortness of breath and chest pain, fatigue, malaise, nausea, vomiting, abdominal pain and diarrhea. Has not had sick contact with anyone. Has history of seasonal allergies, no history of asthma. Patient has not flu shot this season. Denies smoking. Has PMH of tonsillar abscess and strep throat in 09/2017.   Stacey Morrow has a current medication list which includes the following prescription(s): sumatriptan and trianex. Also has No Known Allergies.  Stacey Morrow  has a past medical history of Constipation, Eczema, Headache, and Iron deficiency anemia. Also  has a past surgical history that includes Anterior cruciate ligament repair (2006).   Objective:   Vitals: BP 112/60   Pulse 95   Temp 98.4 F (36.9 C) (Oral)   Resp 20   Ht 5' 7.21" (1.707 m)   Wt 136 lb (61.7 kg)   LMP 01/07/2018 (Exact Date)   SpO2 98%   BMI 21.17 kg/m   Physical Exam  Constitutional: She is oriented to person, place, and time. She appears well-developed and well-nourished. No distress.  HENT:  Head: Normocephalic and atraumatic.  Right Ear: External ear and ear canal normal. Tympanic membrane is not erythematous and not bulging. A middle ear effusion is present.  Left Ear: External ear and ear canal normal. Tympanic membrane is not erythematous and not bulging. A middle ear effusion is present.  Nose: Mucosal edema (moderate b/l) present. Right sinus exhibits maxillary sinus tenderness (mild). Right  sinus exhibits no frontal sinus tenderness. Left sinus exhibits no maxillary sinus tenderness and no frontal sinus tenderness.  Mouth/Throat: Uvula is midline and mucous membranes are normal. No trismus in the jaw. No uvula swelling. Posterior oropharyngeal erythema present. No posterior oropharyngeal edema or tonsillar abscesses. Tonsils are 1+ on the right. Tonsils are 1+ on the left. No tonsillar exudate.  Eyes: Conjunctivae are normal.  Neck: Normal range of motion.  Cardiovascular: Normal rate, regular rhythm, normal heart sounds and intact distal pulses.  Pulmonary/Chest: Effort normal and breath sounds normal. She has no decreased breath sounds. She has no wheezes. She has no rhonchi. She has no rales.  Lymphadenopathy:       Head (right side): No submental, no submandibular, no tonsillar, no preauricular, no posterior auricular and no occipital adenopathy present.       Head (left side): No submental, no submandibular, no tonsillar, no preauricular, no posterior auricular and no occipital adenopathy present.    She has cervical adenopathy.       Right cervical: Posterior cervical adenopathy present.       Left cervical: Posterior cervical adenopathy present.       Right: No supraclavicular adenopathy present.       Left: No supraclavicular adenopathy present.  Neurological: She is alert and oriented to person, place, and time.  Skin: Skin is warm and dry.  Psychiatric: She has a normal mood and affect.  Vitals reviewed.   Results for orders placed or performed in visit on 01/11/18 (from the past 24 hour(s))  POCT rapid strep A     Status: Normal   Collection Time: 01/11/18  9:19 AM  Result Value Ref Range   Rapid Strep A Screen Negative Negative    Assessment and Plan :  1. Acute non-recurrent maxillary sinusitis Due to hx, duration, and no improvement with over-the-counter medications, will treat with antibiotic at this time.  Recommended continuing with ibuprofen, Mucinex,  cetirizine, and starting daily nasal saline rinses.  Advised to return to clinic if symptoms worsen, do not improve, or as needed.  2. Sore throat Rapid strep negative, pt reassured. Continue with above plan.  - POCT rapid strep A   Addendum: Pt contacted office after picking up amoxicillin at the pharmacy and said that the last time she had amoxicillin she had diarrhea and prefers another medication. I have therefore sent in new Rx for doxycycline. Informed her that she should take her amoxicillin Rx back to the pharmacy to have the tablets properly discarded when he picks up doxy. Educated on potential side effects of doxy. Pt voices understanding.   Meds ordered this encounter  Medications  . DISCONTD: amoxicillin (AMOXIL) 875 MG tablet    Sig: Take 1 tablet (875 mg total) by mouth 2 (two) times daily for 7 days.    Dispense:  14 tablet    Refill:  0    Order Specific Question:   Supervising Provider    Answer:   Horald Pollen [id number]  . doxycycline (VIBRAMYCIN) 100 MG capsule    Sig: Take 1 capsule (100 mg total) by mouth 2 (two) times daily for 7 days.    Dispense:  14 capsule    Refill:  0    Order Specific Question:   Supervising Provider    Answer:   Horald Pollen [[id number]]     Tenna Delaine, PA-C  [facility] 01/11/2018 9:26 AM

## 2018-01-11 NOTE — Addendum Note (Signed)
Addended by: Tenna Delaine D on: 01/11/2018 05:48 PM   Modules accepted: Orders

## 2018-01-12 NOTE — Telephone Encounter (Signed)
Doxy was called in.

## 2018-02-22 DIAGNOSIS — Z1159 Encounter for screening for other viral diseases: Secondary | ICD-10-CM | POA: Diagnosis not present

## 2018-02-22 DIAGNOSIS — Z113 Encounter for screening for infections with a predominantly sexual mode of transmission: Secondary | ICD-10-CM | POA: Diagnosis not present

## 2018-02-22 DIAGNOSIS — N760 Acute vaginitis: Secondary | ICD-10-CM | POA: Diagnosis not present

## 2018-02-22 DIAGNOSIS — N9089 Other specified noninflammatory disorders of vulva and perineum: Secondary | ICD-10-CM | POA: Diagnosis not present

## 2018-02-28 DIAGNOSIS — A609 Anogenital herpesviral infection, unspecified: Secondary | ICD-10-CM | POA: Diagnosis not present

## 2018-03-01 DIAGNOSIS — J039 Acute tonsillitis, unspecified: Secondary | ICD-10-CM | POA: Diagnosis not present

## 2018-03-01 DIAGNOSIS — J029 Acute pharyngitis, unspecified: Secondary | ICD-10-CM | POA: Diagnosis not present

## 2018-03-28 DIAGNOSIS — R87612 Low grade squamous intraepithelial lesion on cytologic smear of cervix (LGSIL): Secondary | ICD-10-CM | POA: Diagnosis not present

## 2018-07-03 DIAGNOSIS — L309 Dermatitis, unspecified: Secondary | ICD-10-CM | POA: Diagnosis not present

## 2018-07-03 DIAGNOSIS — Z23 Encounter for immunization: Secondary | ICD-10-CM | POA: Diagnosis not present

## 2018-07-21 ENCOUNTER — Other Ambulatory Visit (HOSPITAL_COMMUNITY): Payer: Self-pay | Admitting: Nurse Practitioner

## 2018-07-21 ENCOUNTER — Other Ambulatory Visit: Payer: Self-pay | Admitting: Nurse Practitioner

## 2018-07-21 DIAGNOSIS — R1084 Generalized abdominal pain: Secondary | ICD-10-CM | POA: Diagnosis not present

## 2018-07-21 DIAGNOSIS — R1030 Lower abdominal pain, unspecified: Secondary | ICD-10-CM

## 2018-07-21 DIAGNOSIS — R739 Hyperglycemia, unspecified: Secondary | ICD-10-CM | POA: Diagnosis not present

## 2018-07-21 DIAGNOSIS — R102 Pelvic and perineal pain: Secondary | ICD-10-CM

## 2018-07-24 ENCOUNTER — Ambulatory Visit (HOSPITAL_COMMUNITY)
Admission: RE | Admit: 2018-07-24 | Discharge: 2018-07-24 | Disposition: A | Discharge status: Home / Self Care | Payer: BLUE CROSS/BLUE SHIELD | Source: Ambulatory Visit | Attending: Nurse Practitioner | Admitting: Nurse Practitioner

## 2018-07-24 ENCOUNTER — Other Ambulatory Visit: Payer: Self-pay

## 2018-07-24 DIAGNOSIS — D259 Leiomyoma of uterus, unspecified: Secondary | ICD-10-CM | POA: Diagnosis not present

## 2018-07-24 DIAGNOSIS — R1030 Lower abdominal pain, unspecified: Secondary | ICD-10-CM | POA: Diagnosis not present

## 2018-07-24 DIAGNOSIS — R102 Pelvic and perineal pain: Secondary | ICD-10-CM

## 2018-07-24 DIAGNOSIS — K769 Liver disease, unspecified: Secondary | ICD-10-CM | POA: Diagnosis not present

## 2018-11-08 DIAGNOSIS — Z6822 Body mass index (BMI) 22.0-22.9, adult: Secondary | ICD-10-CM | POA: Diagnosis not present

## 2018-11-08 DIAGNOSIS — Z01419 Encounter for gynecological examination (general) (routine) without abnormal findings: Secondary | ICD-10-CM | POA: Diagnosis not present

## 2018-12-06 DIAGNOSIS — N83201 Unspecified ovarian cyst, right side: Secondary | ICD-10-CM | POA: Diagnosis not present

## 2019-03-26 DIAGNOSIS — M222X1 Patellofemoral disorders, right knee: Secondary | ICD-10-CM | POA: Diagnosis not present

## 2019-03-30 DIAGNOSIS — Z20828 Contact with and (suspected) exposure to other viral communicable diseases: Secondary | ICD-10-CM | POA: Diagnosis not present

## 2019-03-30 DIAGNOSIS — Z7189 Other specified counseling: Secondary | ICD-10-CM | POA: Diagnosis not present

## 2019-04-19 DIAGNOSIS — Z03818 Encounter for observation for suspected exposure to other biological agents ruled out: Secondary | ICD-10-CM | POA: Diagnosis not present

## 2019-04-19 DIAGNOSIS — J029 Acute pharyngitis, unspecified: Secondary | ICD-10-CM | POA: Diagnosis not present

## 2019-04-23 DIAGNOSIS — Z23 Encounter for immunization: Secondary | ICD-10-CM | POA: Diagnosis not present

## 2019-04-23 DIAGNOSIS — R070 Pain in throat: Secondary | ICD-10-CM | POA: Diagnosis not present

## 2019-07-05 DIAGNOSIS — L309 Dermatitis, unspecified: Secondary | ICD-10-CM | POA: Diagnosis not present

## 2019-07-05 DIAGNOSIS — L818 Other specified disorders of pigmentation: Secondary | ICD-10-CM | POA: Diagnosis not present

## 2019-07-05 DIAGNOSIS — L659 Nonscarring hair loss, unspecified: Secondary | ICD-10-CM | POA: Diagnosis not present

## 2019-07-12 DIAGNOSIS — D509 Iron deficiency anemia, unspecified: Secondary | ICD-10-CM | POA: Diagnosis not present

## 2019-07-12 DIAGNOSIS — Z113 Encounter for screening for infections with a predominantly sexual mode of transmission: Secondary | ICD-10-CM | POA: Diagnosis not present

## 2019-07-12 DIAGNOSIS — F419 Anxiety disorder, unspecified: Secondary | ICD-10-CM | POA: Diagnosis not present

## 2019-07-12 DIAGNOSIS — Z0000 Encounter for general adult medical examination without abnormal findings: Secondary | ICD-10-CM | POA: Diagnosis not present

## 2019-08-14 DIAGNOSIS — F419 Anxiety disorder, unspecified: Secondary | ICD-10-CM | POA: Diagnosis not present

## 2019-08-30 ENCOUNTER — Ambulatory Visit: Payer: BLUE CROSS/BLUE SHIELD

## 2019-09-06 ENCOUNTER — Ambulatory Visit: Payer: BC Managed Care – PPO | Attending: Family

## 2019-09-06 DIAGNOSIS — Z23 Encounter for immunization: Secondary | ICD-10-CM

## 2019-09-06 NOTE — Progress Notes (Signed)
   Covid-19 Vaccination Clinic  Name:  Stacey Morrow    MRN: [id number] DOB: [birth year]  09/06/2019  [name] was observed post Covid-19 immunization for 15 minutes without incident. She was provided with Vaccine Information Sheet and instruction to access the V-Safe system.   [name] was instructed to call 911 with any severe reactions post vaccine: Marland Kitchen Difficulty breathing  . Swelling of face and throat  . A fast heartbeat  . A bad rash all over body  . Dizziness and weakness   Immunizations Administered    Name Date Dose VIS Date Route   Moderna COVID-19 Vaccine 09/06/2019  4:16 PM 0.5 mL 04/2019 Intramuscular   Manufacturer: Moderna   Lot: [id number]   [id number]

## 2019-10-02 ENCOUNTER — Ambulatory Visit: Payer: BC Managed Care – PPO | Attending: Family

## 2019-10-02 DIAGNOSIS — Z23 Encounter for immunization: Secondary | ICD-10-CM

## 2019-10-02 NOTE — Progress Notes (Signed)
   Covid-19 Vaccination Clinic  Name:  Stacey Morrow    MRN: [id number] DOB: 1985-10-15  10/02/2019  [name] was observed post Covid-19 immunization for 15 minutes without incident. She was provided with Vaccine Information Sheet and instruction to access the V-Safe system.   [name] was instructed to call 911 with any severe reactions post vaccine: Marland Kitchen Difficulty breathing  . Swelling of face and throat  . A fast heartbeat  . A bad rash all over body  . Dizziness and weakness   Immunizations Administered    Name Date Dose VIS Date Route   Moderna COVID-19 Vaccine 10/02/2019  4:01 PM 0.5 mL 04/2019 Intramuscular   Manufacturer: Moderna   Lot: [id number]   [id number]

## 2019-10-08 DIAGNOSIS — Z20822 Contact with and (suspected) exposure to covid-19: Secondary | ICD-10-CM | POA: Diagnosis not present

## 2019-10-08 DIAGNOSIS — U071 COVID-19: Secondary | ICD-10-CM | POA: Diagnosis not present

## 2020-01-22 ENCOUNTER — Other Ambulatory Visit: Payer: Self-pay

## 2020-03-06 IMAGING — US US PELVIS COMPLETE WITH TRANSVAGINAL
1 series · 13 of 25 positions shown · non-contrast
Comparison: None

CLINICAL DATA: Initial evaluation for pelvic and lower abdominal
pain for 4 days.



[Series 1: us pelvis complete with transvaginal · 75 acquisitions, 13 frames shown]
[im 1/75]
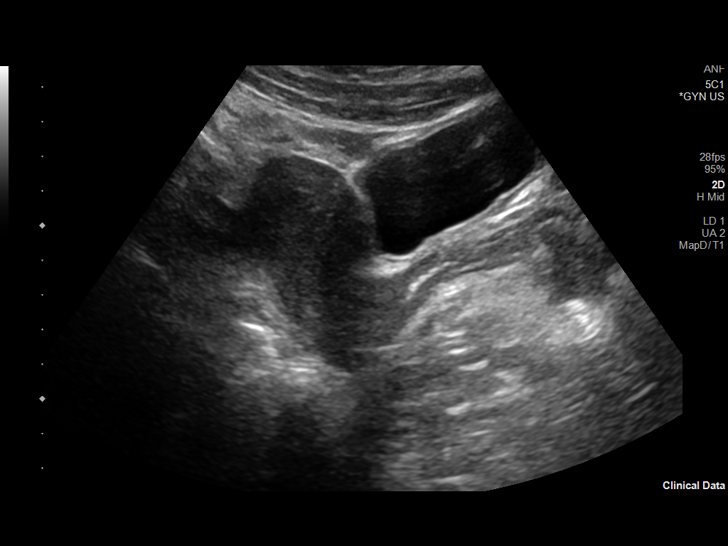
[im 7/75]
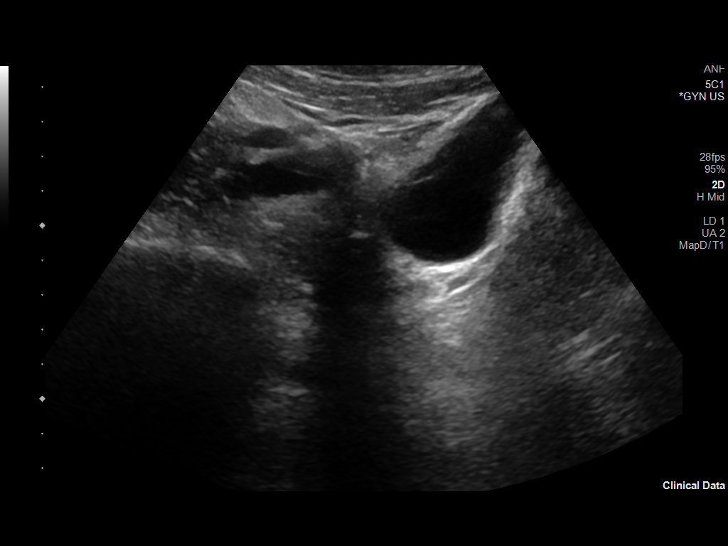
[im 13/75]
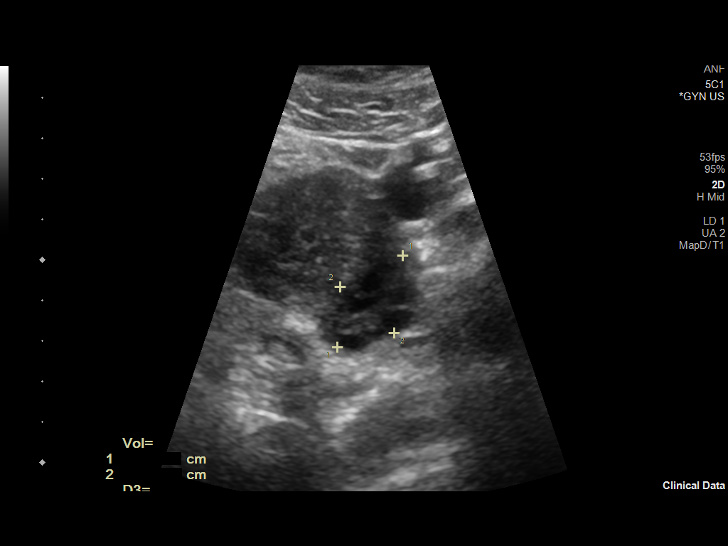
[im 19/75]
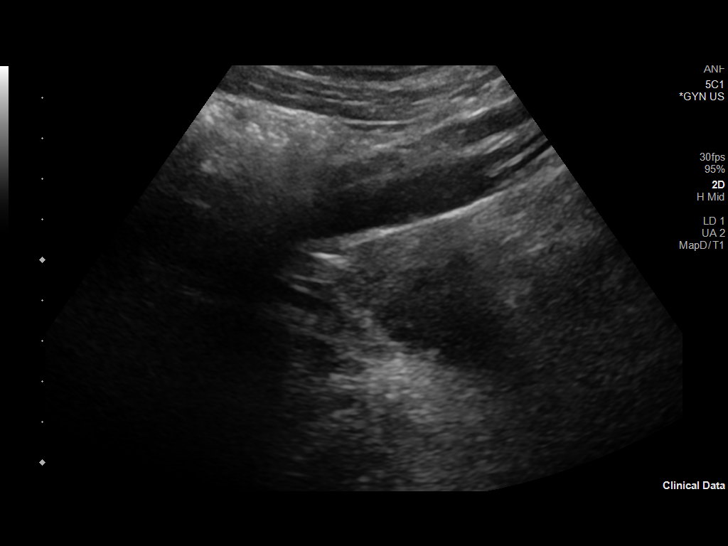
[im 25/75]
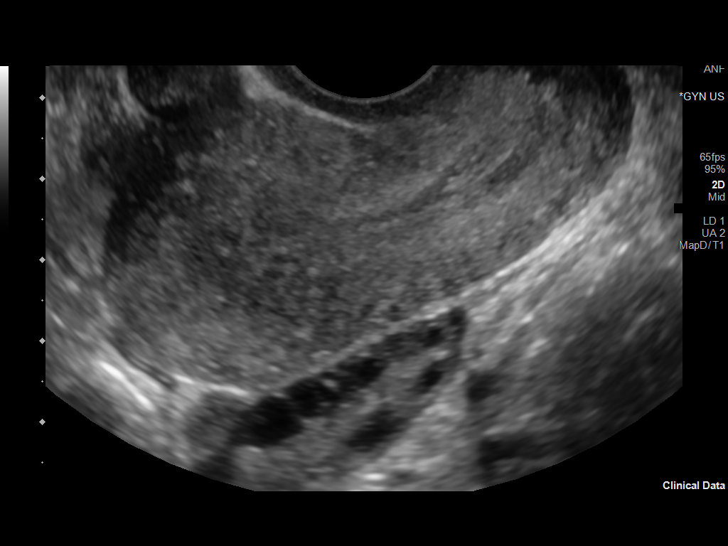
[im 31/75]
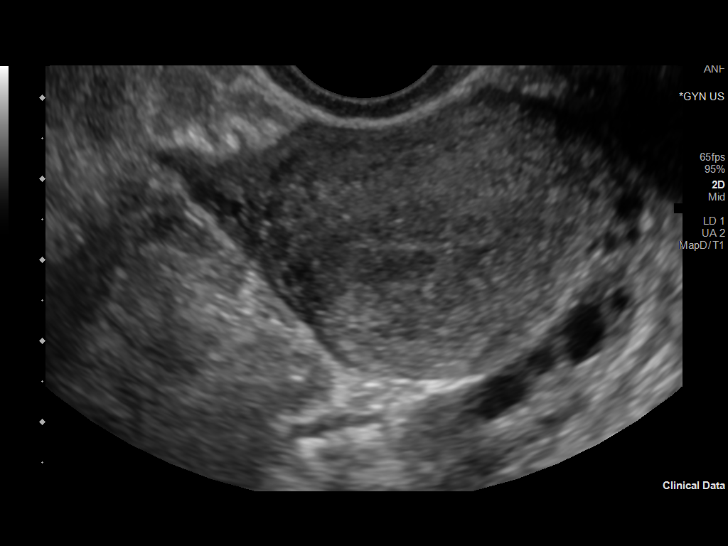
[im 38/75]
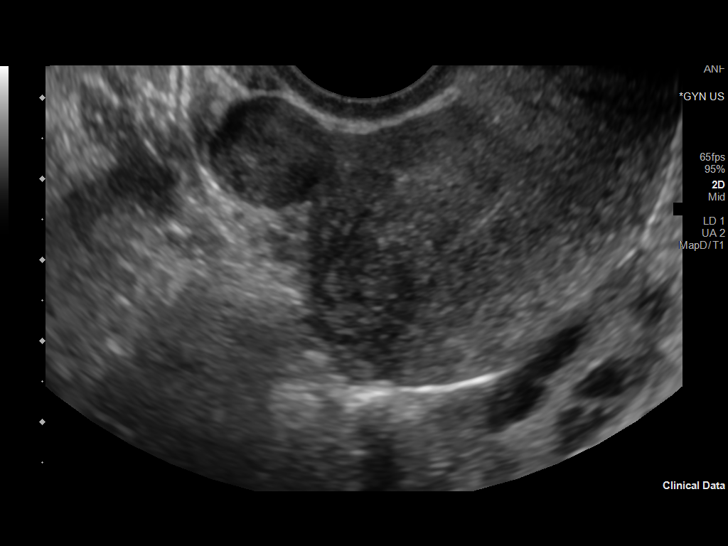
[im 44/75]
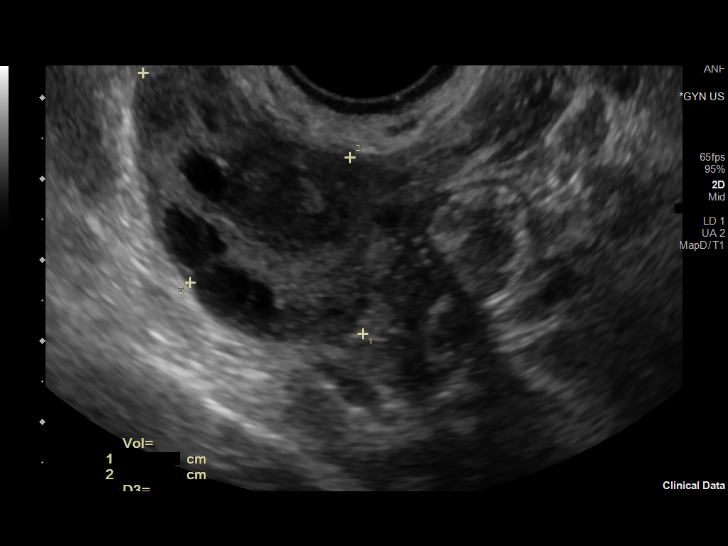
[im 50/75]
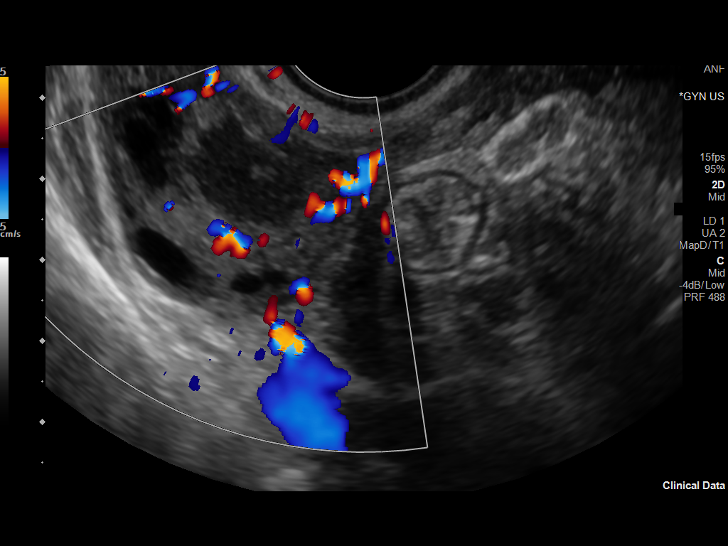
[im 56/75]
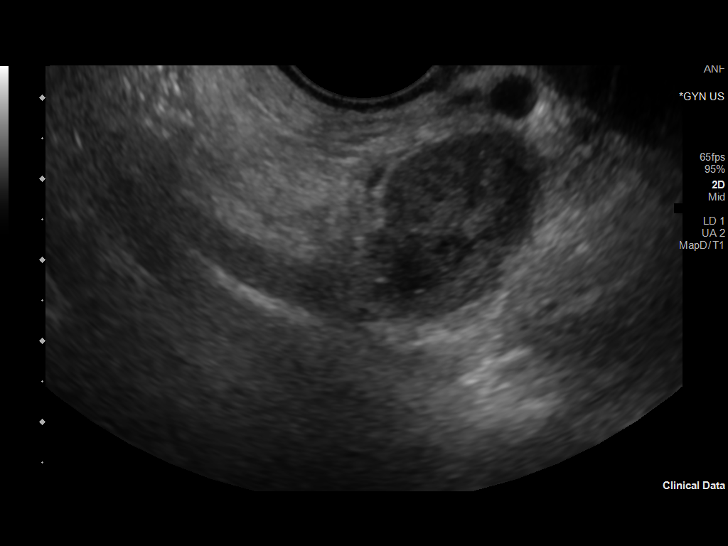
[im 62/75]
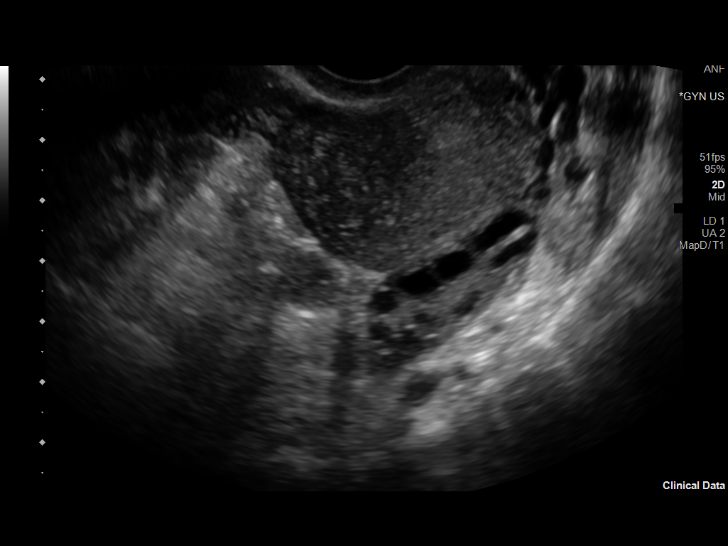
[im 68/75]
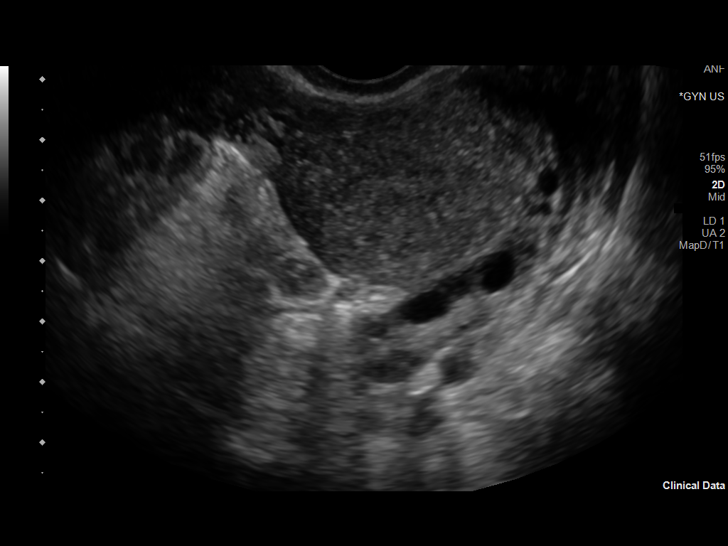
[im 75/75]
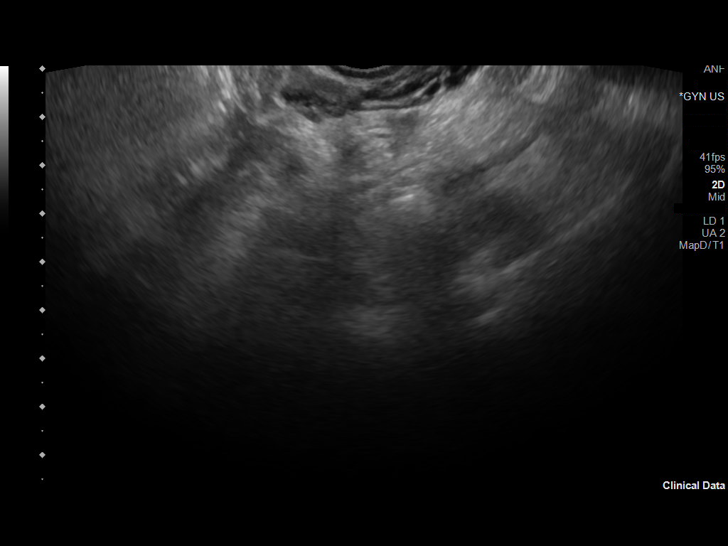

[13 of 25 positions shown; findings below may reference images not displayed]

FINDINGS: Uterus

Measurements: 7.0 x 3.4 x 4.6 cm = volume: 58.0 mL. 1.5 x 1.4 x
cm exophytic fibroid seen extending from the left anterior uterine
fundus.

Endometrium

Thickness: 3.4 mm.  No focal abnormality visualized.

Right ovary

Measurements: 4.2 x 2.5 x 3.4 cm = volume: 18.8 mL. 1.4 x 1.6 x
cm complex hypoechoic area within the right ovary, indeterminate.

Left ovary

Measurements: 3.9 x 1.5 x 2.9 cm = volume: 9.1 mm mL. Normal
appearance/no adnexal mass.

Other findings

No abnormal free fluid.
IMPRESSION: 1. 1.6 cm complex hypoechoic lesion within the right ovary,
indeterminate. Differential considerations include a mildly
complex/proteinaceous physiologic follicular cyst, hemorrhagic cyst,
or possibly endometrioma. Follow-up ultrasound in 6-12 weeks could
be performed for further evaluation as clinically warranted.
2. 1.7 cm exophytic left fundal fibroid.
3. Otherwise unremarkable and normal pelvic ultrasound.

## 2020-03-06 IMAGING — US ULTRASOUND ABDOMEN COMPLETE
1 series · 13 of 25 positions shown · non-contrast
Comparison: None.

CLINICAL DATA: Initial evaluation for acute abdominal pain for 4
days.

EXAM:
ABDOMEN ULTRASOUND COMPLETE

[Series 1: ultrasound abdomen complete · 13 of 79 slices shown]
[im 1/79]
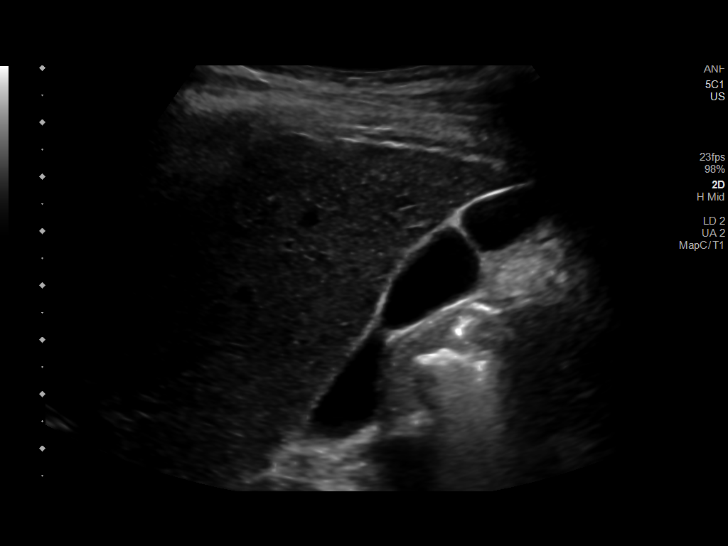
[im 7/79]
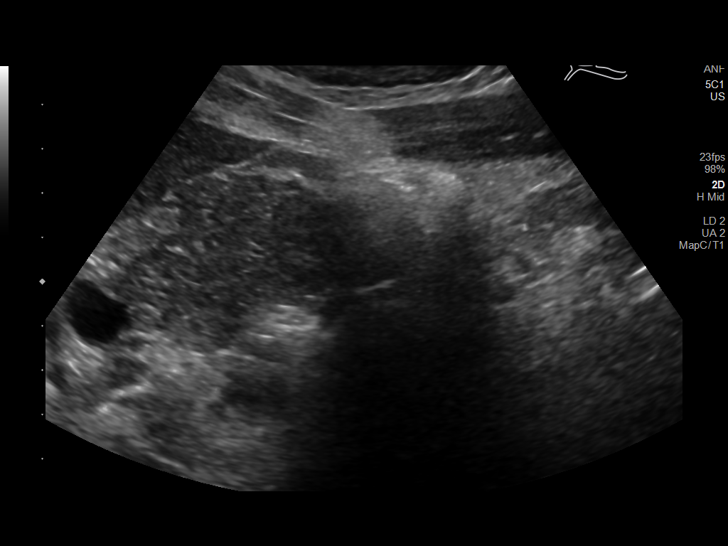
[im 14/79]
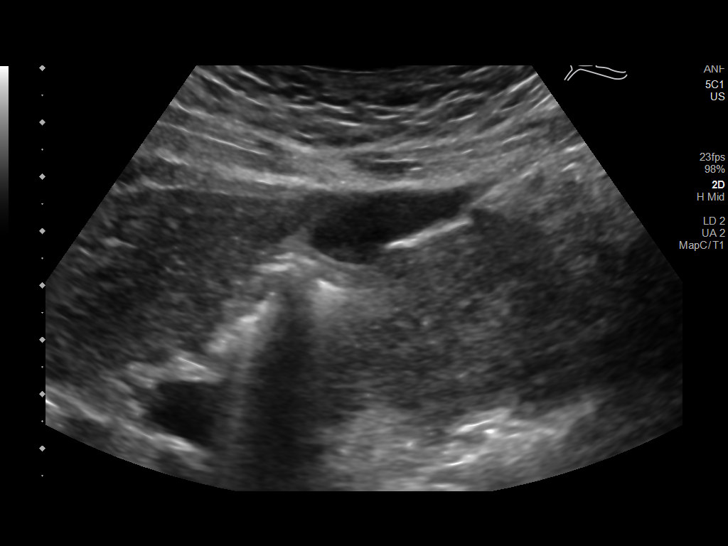
[im 20/79]
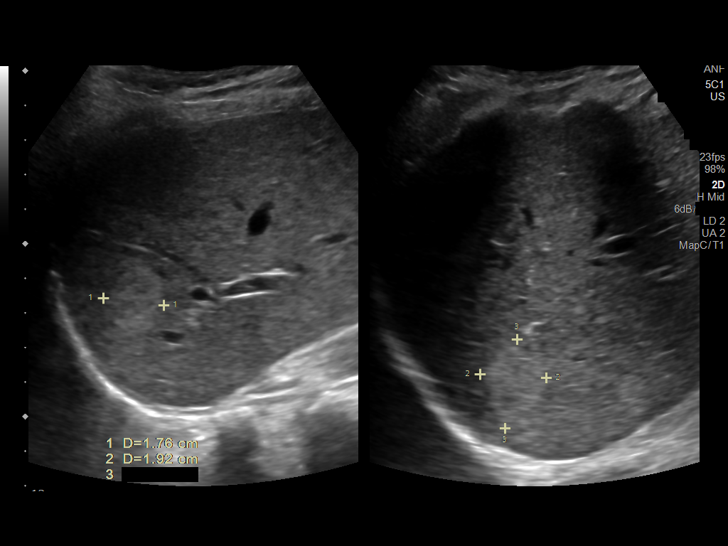
[im 27/79]
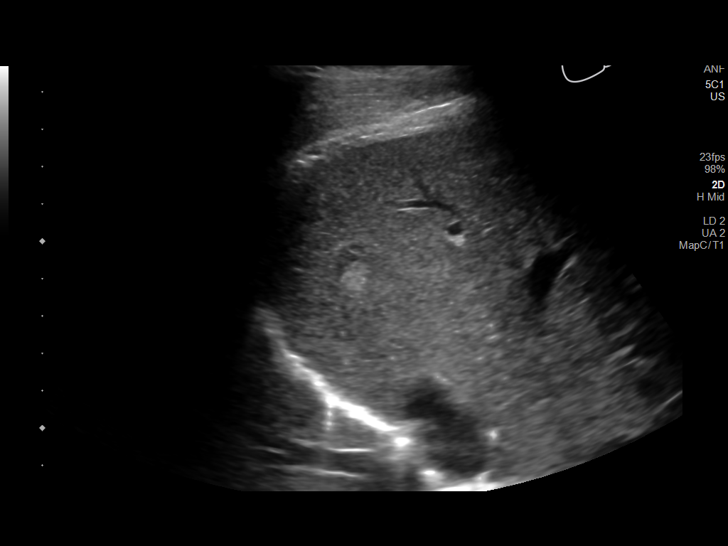
[im 33/79]
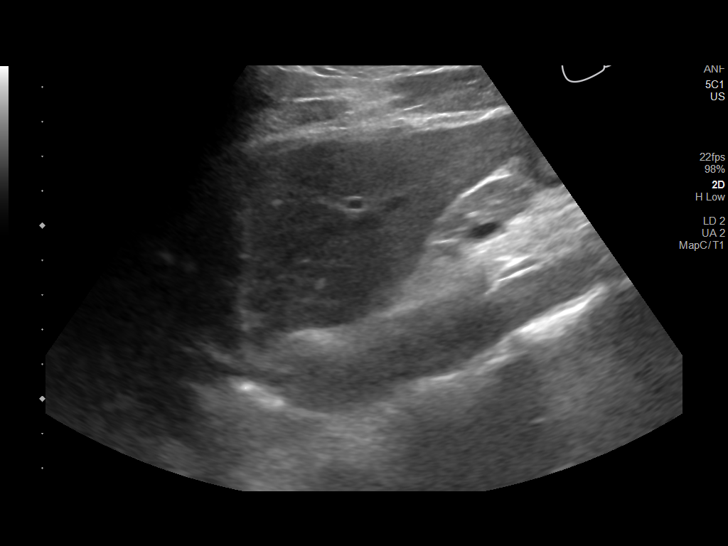
[im 40/79]
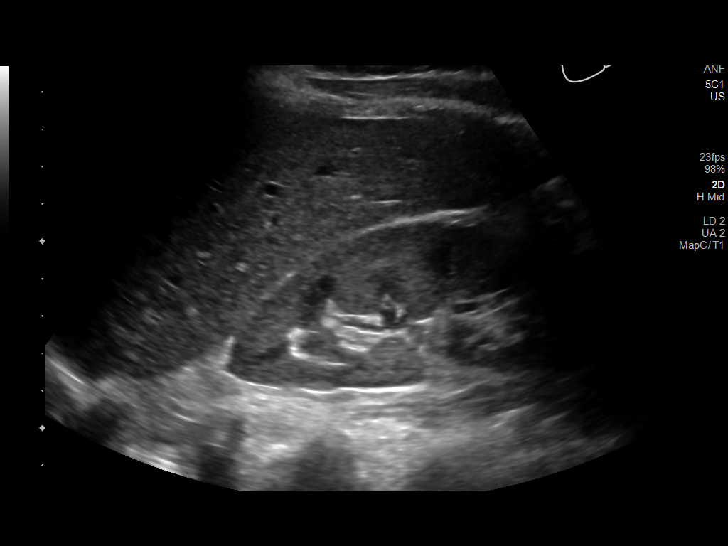
[im 46/79]
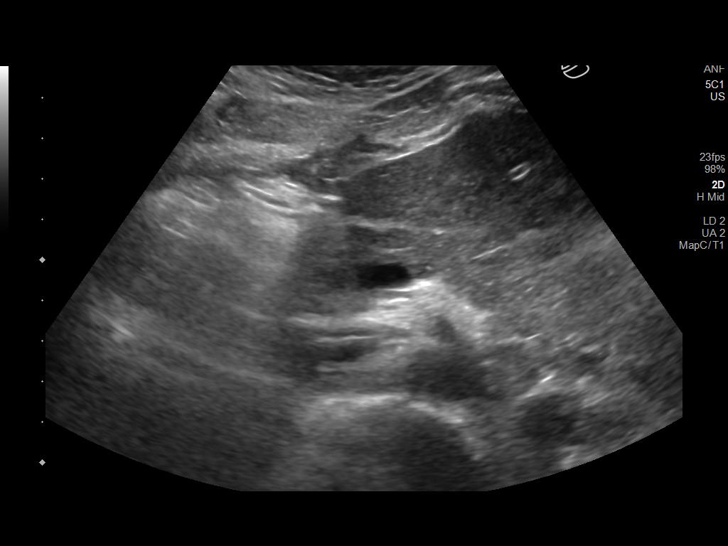
[im 53/79]
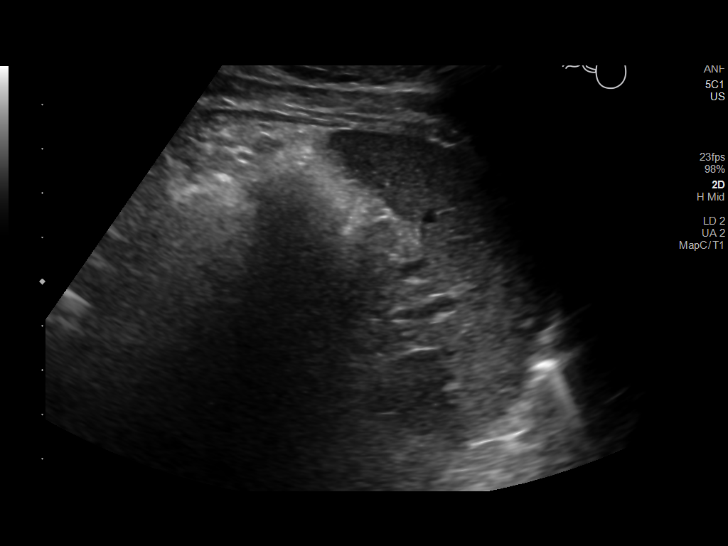
[im 59/79]
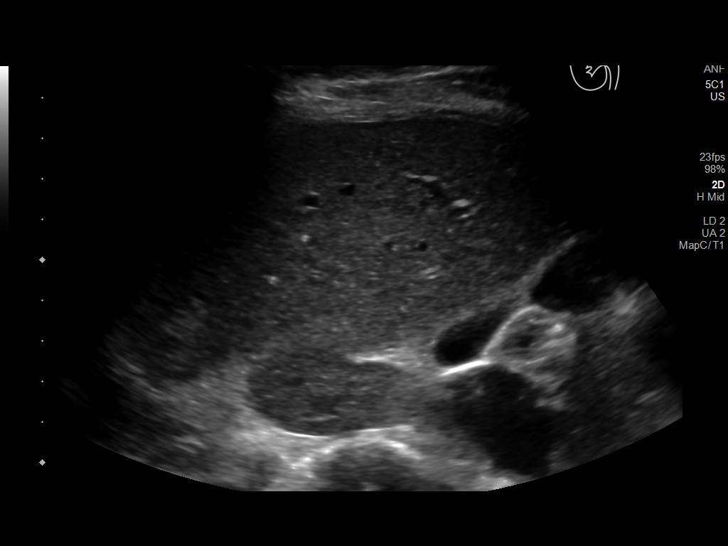
[im 66/79]
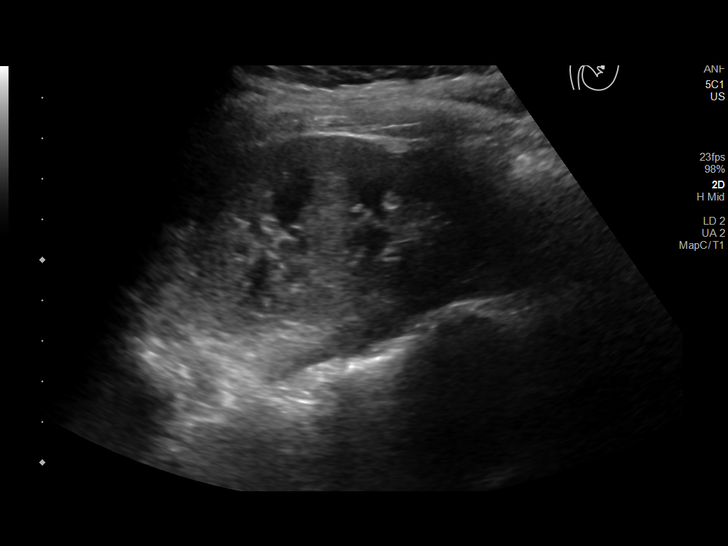
[im 72/79]
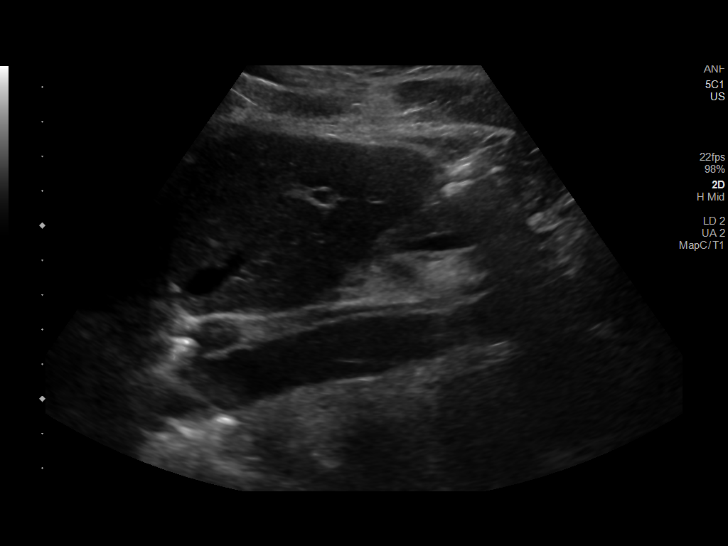
[im 79/79]
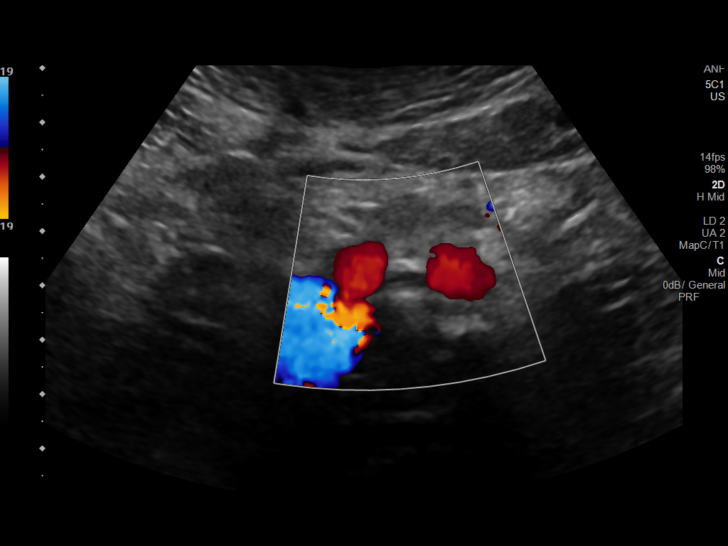

[13 of 25 positions shown; findings below may reference images not displayed]

FINDINGS: Gallbladder: No gallstones or wall thickening visualized. No
sonographic Murphy sign noted by sonographer.

Common bile duct: Diameter: 1.3 mm

Liver: Multiple well-circumscribed fairly homogeneous echogenic
lesion seen within the right hepatic lobe, largest of which measures
2.8 x 2.6 x 2.2 cm. Within normal limits in parenchymal
echogenicity. Portal vein is patent on color Doppler imaging with
normal direction of blood flow towards the liver.

IVC: No abnormality visualized.

Pancreas: Visualized portion unremarkable.

Spleen: Size and appearance within normal limits.

Right Kidney: Length: 11.1 cm. Echogenicity within normal limits. No
mass or hydronephrosis visualized.

Left Kidney: Length: 9.2 cm. Echogenicity within normal limits. No
mass or hydronephrosis visualized.

Abdominal aorta: No aneurysm visualized.

Other findings: None.
IMPRESSION: 1. Multiple well-circumscribed hyperechoic lesions within the right
hepatic lobe, largest of which measures 2.8 cm. While these are
nonspecific, findings favored to reflect benign hemangiomas.
Correlation with dedicated liver protocol MRI suggested for
confirmatory purposes.
2. Otherwise unremarkable and normal abdominal ultrasound. No acute
finding identified.

## 2020-03-07 DIAGNOSIS — Z01419 Encounter for gynecological examination (general) (routine) without abnormal findings: Secondary | ICD-10-CM | POA: Diagnosis not present

## 2020-03-07 DIAGNOSIS — Z6822 Body mass index (BMI) 22.0-22.9, adult: Secondary | ICD-10-CM | POA: Diagnosis not present

## 2020-03-07 DIAGNOSIS — Z113 Encounter for screening for infections with a predominantly sexual mode of transmission: Secondary | ICD-10-CM | POA: Diagnosis not present

## 2020-04-15 DIAGNOSIS — R87610 Atypical squamous cells of undetermined significance on cytologic smear of cervix (ASC-US): Secondary | ICD-10-CM | POA: Diagnosis not present

## 2020-04-16 DIAGNOSIS — N870 Mild cervical dysplasia: Secondary | ICD-10-CM | POA: Diagnosis not present

## 2020-06-23 DIAGNOSIS — Z20822 Contact with and (suspected) exposure to covid-19: Secondary | ICD-10-CM | POA: Diagnosis not present

## 2020-06-24 DIAGNOSIS — Z20822 Contact with and (suspected) exposure to covid-19: Secondary | ICD-10-CM | POA: Diagnosis not present

## 2020-07-15 DIAGNOSIS — L503 Dermatographic urticaria: Secondary | ICD-10-CM | POA: Diagnosis not present

## 2020-07-15 DIAGNOSIS — L2084 Intrinsic (allergic) eczema: Secondary | ICD-10-CM | POA: Diagnosis not present

## 2020-07-17 ENCOUNTER — Encounter: Payer: Self-pay | Admitting: Physician Assistant

## 2020-07-23 ENCOUNTER — Encounter: Payer: Self-pay | Admitting: Physician Assistant

## 2020-08-17 DIAGNOSIS — Z20822 Contact with and (suspected) exposure to covid-19: Secondary | ICD-10-CM | POA: Diagnosis not present

## 2020-09-11 DIAGNOSIS — Z0000 Encounter for general adult medical examination without abnormal findings: Secondary | ICD-10-CM | POA: Diagnosis not present

## 2020-09-11 DIAGNOSIS — Z13220 Encounter for screening for lipoid disorders: Secondary | ICD-10-CM | POA: Diagnosis not present

## 2021-01-05 DIAGNOSIS — M546 Pain in thoracic spine: Secondary | ICD-10-CM | POA: Diagnosis not present

## 2021-01-05 DIAGNOSIS — N62 Hypertrophy of breast: Secondary | ICD-10-CM | POA: Diagnosis not present

## 2021-03-04 DIAGNOSIS — H8120 Vestibular neuronitis, unspecified ear: Secondary | ICD-10-CM | POA: Diagnosis not present

## 2021-03-04 DIAGNOSIS — H6592 Unspecified nonsuppurative otitis media, left ear: Secondary | ICD-10-CM | POA: Diagnosis not present

## 2021-03-23 DIAGNOSIS — H16041 Marginal corneal ulcer, right eye: Secondary | ICD-10-CM | POA: Diagnosis not present

## 2021-03-27 DIAGNOSIS — H16041 Marginal corneal ulcer, right eye: Secondary | ICD-10-CM | POA: Diagnosis not present

## 2021-05-20 DIAGNOSIS — N760 Acute vaginitis: Secondary | ICD-10-CM | POA: Diagnosis not present

## 2021-05-20 DIAGNOSIS — Z113 Encounter for screening for infections with a predominantly sexual mode of transmission: Secondary | ICD-10-CM | POA: Diagnosis not present

## 2021-06-18 DIAGNOSIS — Z1329 Encounter for screening for other suspected endocrine disorder: Secondary | ICD-10-CM | POA: Diagnosis not present

## 2021-06-18 DIAGNOSIS — Z124 Encounter for screening for malignant neoplasm of cervix: Secondary | ICD-10-CM | POA: Diagnosis not present

## 2021-06-18 DIAGNOSIS — Z6823 Body mass index (BMI) 23.0-23.9, adult: Secondary | ICD-10-CM | POA: Diagnosis not present

## 2021-06-18 DIAGNOSIS — L659 Nonscarring hair loss, unspecified: Secondary | ICD-10-CM | POA: Diagnosis not present

## 2021-06-18 DIAGNOSIS — Z01419 Encounter for gynecological examination (general) (routine) without abnormal findings: Secondary | ICD-10-CM | POA: Diagnosis not present

## 2021-11-16 DIAGNOSIS — L658 Other specified nonscarring hair loss: Secondary | ICD-10-CM | POA: Diagnosis not present

## 2021-11-16 DIAGNOSIS — L2084 Intrinsic (allergic) eczema: Secondary | ICD-10-CM | POA: Diagnosis not present

## 2022-03-25 DIAGNOSIS — F411 Generalized anxiety disorder: Secondary | ICD-10-CM | POA: Diagnosis not present

## 2022-03-25 DIAGNOSIS — F331 Major depressive disorder, recurrent, moderate: Secondary | ICD-10-CM | POA: Diagnosis not present

## 2022-03-25 DIAGNOSIS — F429 Obsessive-compulsive disorder, unspecified: Secondary | ICD-10-CM | POA: Diagnosis not present

## 2022-04-08 DIAGNOSIS — F411 Generalized anxiety disorder: Secondary | ICD-10-CM | POA: Diagnosis not present

## 2022-04-08 DIAGNOSIS — F331 Major depressive disorder, recurrent, moderate: Secondary | ICD-10-CM | POA: Diagnosis not present

## 2022-04-08 DIAGNOSIS — F429 Obsessive-compulsive disorder, unspecified: Secondary | ICD-10-CM | POA: Diagnosis not present

## 2022-05-06 DIAGNOSIS — F411 Generalized anxiety disorder: Secondary | ICD-10-CM | POA: Diagnosis not present

## 2022-05-06 DIAGNOSIS — F429 Obsessive-compulsive disorder, unspecified: Secondary | ICD-10-CM | POA: Diagnosis not present

## 2022-05-06 DIAGNOSIS — F331 Major depressive disorder, recurrent, moderate: Secondary | ICD-10-CM | POA: Diagnosis not present

## 2022-05-20 DIAGNOSIS — I10 Essential (primary) hypertension: Secondary | ICD-10-CM | POA: Diagnosis not present

## 2022-05-20 DIAGNOSIS — F418 Other specified anxiety disorders: Secondary | ICD-10-CM | POA: Diagnosis not present

## 2022-07-20 DIAGNOSIS — Z03818 Encounter for observation for suspected exposure to other biological agents ruled out: Secondary | ICD-10-CM | POA: Diagnosis not present

## 2022-07-20 DIAGNOSIS — R0981 Nasal congestion: Secondary | ICD-10-CM | POA: Diagnosis not present

## 2022-08-17 ENCOUNTER — Encounter (HOSPITAL_COMMUNITY): Payer: Self-pay

## 2022-08-17 ENCOUNTER — Ambulatory Visit (HOSPITAL_COMMUNITY)
Admission: RE | Admit: 2022-08-17 | Discharge: 2022-08-17 | Disposition: A | Discharge status: Home / Self Care | Payer: BC Managed Care – PPO | Source: Ambulatory Visit

## 2022-08-17 VITALS — BP 135/93 | HR 85 | Temp 98.8°F | Resp 18

## 2022-08-17 DIAGNOSIS — J020 Streptococcal pharyngitis: Secondary | ICD-10-CM | POA: Diagnosis not present

## 2022-08-17 LAB — POCT RAPID STREP A, ED / UC: Streptococcus, Group A Screen (Direct): POSITIVE — AB

## 2022-08-17 MED ORDER — ACETAMINOPHEN 325 MG PO TABS
650.0000 mg | ORAL_TABLET | Freq: Once | ORAL | Status: AC
  Administered 2022-08-17: 650 mg via ORAL

## 2022-08-17 MED ORDER — ACETAMINOPHEN 325 MG PO TABS
ORAL_TABLET | ORAL | Status: AC
  Filled 2022-08-17: qty 2

## 2022-08-17 MED ORDER — AMOXICILLIN 500 MG PO CAPS: 500.0000 mg | ORAL_CAPSULE | Freq: Two times a day (BID) | ORAL | 0 refills | Status: AC

## 2022-08-17 NOTE — Discharge Instructions (Signed)
Your strep test was positive. Please take the medication for the full 10 days. It's important to finish all 10 days even when you are feeling better. Otherwise the infection can come back worse. You should not have any pills leftover! Take medicine with food to avoid upset stomach.  Make sure to change your toothbrush after 1-2 days  Ibuprofen 600-800 mg Tylenol 500-650 mg  Alternated every 4 hours  Drink lots of fluids!

## 2022-08-17 NOTE — ED Provider Notes (Addendum)
[facility]    CSN: [id number] Arrival date & time: 08/17/22  1158      History   Chief Complaint Chief Complaint  Patient presents with   Sore Throat    Entered by patient   Headache    HPI Stacey Morrow is a 37 y.o. female.  Yesterday developed sore throat and headache This morning sore throat worsened Pain with swallowing, feels her lymph nodes are swollen No fevers or chills Has not taken any medications  No known sick contacts but she has been around kids recently  Past Medical History:  Diagnosis Date   Constipation    Eczema    Headache    Iron deficiency anemia     Patient Active Problem List   Diagnosis Date Noted   Smoker 10/07/2017   Chronic migraine without aura without status migrainosus, not intractable 07/24/2017    Past Surgical History:  Procedure Laterality Date   ANTERIOR CRUCIATE LIGAMENT REPAIR  2006   in [place], [place]; hurdler in high school    OB History   No obstetric history on file.      Home Medications    Prior to Admission medications   Medication Sig Start Date End Date Taking? Authorizing Provider  amoxicillin (AMOXIL) 500 MG capsule Take 1 capsule (500 mg total) by mouth 2 (two) times daily for 10 days. 08/17/22 08/27/22 Yes Zair Borawski, Lurena Joiner, PA-C  sertraline (ZOLOFT) 50 MG tablet Take by mouth.   Yes [provider]  valACYclovir (VALTREX) 1000 MG tablet Take 1 tablet by mouth daily. 07/08/21  Yes [provider]  SUMAtriptan (IMITREX) 100 MG tablet Take 1 tablet (100 mg total) by mouth once as needed for up to 1 dose. May repeat in 2 hours if headache persists or recurs. 07/21/17   Anson Fret, MD  TRIANEX 0.05 % OINT Apply topically 2 (two) times daily as needed. For 14 days 06/20/17   [provider]    Family History Family History  Problem Relation Age of Onset   Hypertension Mother    Hypertension Father     Social History Social History   Tobacco Use   Smoking status:  Never   Smokeless tobacco: Never   Tobacco comments:    hookah occasional  Vaping Use   Vaping Use: Some days  Substance Use Topics   Alcohol use: Yes    Comment: social   Drug use: No     Allergies   Patient has no known allergies.   Review of Systems Review of Systems  As per HPI  Physical Exam Triage Vital Signs ED Triage Vitals  Enc Vitals Group     BP 08/17/22 1209 (!) 135/93     Pulse Rate 08/17/22 1209 85     Resp 08/17/22 1209 18     Temp 08/17/22 1209 98.8 F (37.1 C)     Temp Source 08/17/22 1209 Oral     SpO2 08/17/22 1209 96 %     Weight --      Height --      Head Circumference --      Peak Flow --      Pain Score 08/17/22 1207 6     Pain Loc --      Pain Edu? --      Excl. in GC? --    No data found.  Updated Vital Signs BP (!) 135/93 (BP Location: Right Arm)   Pulse 85   Temp 98.8 F (37.1 C) (  Oral)   Resp 18   LMP 08/17/2022 (Exact Date)   SpO2 96%    Physical Exam Vitals and nursing note reviewed.  Constitutional:      General: She is not in acute distress.    Appearance: She is not ill-appearing.     Comments: Tolerating secretions, normal phonation  HENT:     Right Ear: Tympanic membrane and ear canal normal.     Left Ear: Tympanic membrane and ear canal normal.     Mouth/Throat:     Mouth: Mucous membranes are moist.     Pharynx: Oropharynx is clear. Posterior oropharyngeal erythema present. No oropharyngeal exudate.     Tonsils: No tonsillar exudate or tonsillar abscesses. 1+ on the right. 1+ on the left.  Eyes:     Conjunctiva/sclera: Conjunctivae normal.  Cardiovascular:     Rate and Rhythm: Normal rate and regular rhythm.     Heart sounds: Normal heart sounds.  Pulmonary:     Effort: Pulmonary effort is normal.     Breath sounds: Normal breath sounds.  Musculoskeletal:     Cervical back: Normal range of motion. No rigidity.  Lymphadenopathy:     Cervical: Cervical adenopathy present.  Skin:    General: Skin is  warm and dry.  Neurological:     Mental Status: She is alert and oriented to person, place, and time.      UC Treatments / Results  Labs (all labs ordered are listed, but only abnormal results are displayed) Labs Reviewed  POCT RAPID STREP A, ED / UC - Abnormal; Notable for the following components:      Result Value   Streptococcus, Group A Screen (Direct) POSITIVE (*)    All other components within normal limits    EKG   Radiology No results found.  Procedures Procedures (including critical care time)  Medications Ordered in UC Medications  acetaminophen (TYLENOL) tablet 650 mg (650 mg Oral Given 08/17/22 1227)    Initial Impression / Assessment and Plan / UC Course  I have reviewed the triage vital signs and the nursing notes.  Pertinent labs & imaging results that were available during my care of the patient were reviewed by me and considered in my medical decision making (see chart for details).  Tylenol dose given in clinic. Afebrile here, well appearing.  Rapid strep positive. Amox BID x 10 days Return precautions discussed. Patient agrees to plan  Final Clinical Impressions(s) / UC Diagnoses   Final diagnoses:  Strep pharyngitis     Discharge Instructions      Your strep test was positive. Please take the medication for the full 10 days. It's important to finish all 10 days even when you are feeling better. Otherwise the infection can come back worse. You should not have any pills leftover! Take medicine with food to avoid upset stomach.  Make sure to change your toothbrush after 1-2 days  Ibuprofen 600-800 mg Tylenol 500-650 mg  Alternated every 4 hours  Drink lots of fluids!    ED Prescriptions     Medication Sig Dispense Auth. Provider   amoxicillin (AMOXIL) 500 MG capsule Take 1 capsule (500 mg total) by mouth 2 (two) times daily for 10 days. 20 capsule Margues Filippini, Lurena Joiner, PA-C      PDMP not reviewed this encounter.      Merian Wroe,  Lurena Joiner, [place] 08/17/22 1255

## 2022-08-17 NOTE — ED Triage Notes (Signed)
Pt states she has a sore throat and headache started yesterday. She hasn't taken any meds.

## 2022-08-23 ENCOUNTER — Telehealth (HOSPITAL_COMMUNITY): Payer: Self-pay | Admitting: Emergency Medicine

## 2022-08-23 MED ORDER — FLUCONAZOLE 150 MG PO TABS: 150.0000 mg | ORAL_TABLET | Freq: Once | ORAL | 0 refills | Status: AC

## 2022-08-23 NOTE — Telephone Encounter (Signed)
Patient requested antibiotic associated yeast medicine.  Sent per protocol.

## 2022-08-24 DIAGNOSIS — F429 Obsessive-compulsive disorder, unspecified: Secondary | ICD-10-CM | POA: Diagnosis not present

## 2022-08-24 DIAGNOSIS — F331 Major depressive disorder, recurrent, moderate: Secondary | ICD-10-CM | POA: Diagnosis not present

## 2022-09-21 ENCOUNTER — Ambulatory Visit
Admission: RE | Admit: 2022-09-21 | Discharge: 2022-09-21 | Disposition: A | Discharge status: Home / Self Care | Payer: BC Managed Care – PPO | Source: Ambulatory Visit | Attending: Nurse Practitioner | Admitting: Nurse Practitioner

## 2022-09-21 VITALS — BP 129/90 | HR 91 | Temp 99.0°F | Resp 16

## 2022-09-21 DIAGNOSIS — J069 Acute upper respiratory infection, unspecified: Secondary | ICD-10-CM | POA: Diagnosis not present

## 2022-09-21 DIAGNOSIS — Z1152 Encounter for screening for COVID-19: Secondary | ICD-10-CM | POA: Diagnosis not present

## 2022-09-21 DIAGNOSIS — F17210 Nicotine dependence, cigarettes, uncomplicated: Secondary | ICD-10-CM | POA: Insufficient documentation

## 2022-09-21 DIAGNOSIS — R0981 Nasal congestion: Secondary | ICD-10-CM | POA: Diagnosis not present

## 2022-09-21 MED ORDER — CETIRIZINE HCL 10 MG PO TABS: 10.0000 mg | ORAL_TABLET | Freq: Every day | ORAL | 0 refills | Status: AC

## 2022-09-21 MED ORDER — PROMETHAZINE-DM 6.25-15 MG/5ML PO SYRP: 5.0000 mL | ORAL_SOLUTION | Freq: Four times a day (QID) | ORAL | 0 refills | Status: AC | PRN

## 2022-09-21 MED ORDER — FLUTICASONE PROPIONATE 50 MCG/ACT NA SUSP: 1.0000 | Freq: Every day | NASAL | 0 refills | Status: AC

## 2022-09-21 NOTE — ED Triage Notes (Signed)
Pt presents to UC w/ c/o congestion, cough since yesterday.

## 2022-09-21 NOTE — Discharge Instructions (Signed)
The clinic will contact you with results of your COVID test if positive Trial of Zyrtec daily for 2 weeks Flonase daily Promethazine DM as needed for cough.  Please note this medication can make you drowsy.  Do not drink alcohol or drive while you are on this medication Rest and fluids Follow-up with your PCP if your symptoms do not improve Please go to the ER for any worsening symptoms

## 2022-09-21 NOTE — ED Provider Notes (Signed)
[facility]    CSN: [id number] Arrival date & time: 09/21/22  1516      History   Chief Complaint Chief Complaint  Patient presents with   Nasal Congestion    HPI Stacey Morrow is a 37 y.o. female  presents for evaluation of URI symptoms for 1 days. Patient reports associated symptoms of cough and congestion. Denies N/V/D, sore throat, fevers, ear pain, body aches, shortness of breath. Patient does not have a hx of asthma or smoking. No known sick contacts but she has traveled recently.  Pt has taken Benadryl OTC for symptoms. Pt has no other concerns at this time.   HPI  Past Medical History:  Diagnosis Date   Constipation    Eczema    Headache    Iron deficiency anemia     Patient Active Problem List   Diagnosis Date Noted   Smoker 10/07/2017   Chronic migraine without aura without status migrainosus, not intractable 07/24/2017    Past Surgical History:  Procedure Laterality Date   ANTERIOR CRUCIATE LIGAMENT REPAIR  2006   in [place], [place]; hurdler in high school    OB History   No obstetric history on file.      Home Medications    Prior to Admission medications   Medication Sig Start Date End Date Taking? Authorizing Provider  cetirizine (ZYRTEC ALLERGY) 10 MG tablet Take 1 tablet (10 mg total) by mouth daily for 14 days. 09/21/22 10/05/22 Yes Radford Pax, NP  fluticasone (FLONASE) 50 MCG/ACT nasal spray Place 1 spray into both nostrils daily. 09/21/22  Yes Radford Pax, NP  promethazine-dextromethorphan (PROMETHAZINE-DM) 6.25-15 MG/5ML syrup Take 5 mLs by mouth 4 (four) times daily as needed for cough. 09/21/22  Yes Radford Pax, NP  sertraline (ZOLOFT) 50 MG tablet Take by mouth.    [provider]  SUMAtriptan (IMITREX) 100 MG tablet Take 1 tablet (100 mg total) by mouth once as needed for up to 1 dose. May repeat in 2 hours if headache persists or recurs. 07/21/17   Anson Fret, MD  TRIANEX 0.05 % OINT Apply topically 2 (two) times  daily as needed. For 14 days 06/20/17   [provider]  valACYclovir (VALTREX) 1000 MG tablet Take 1 tablet by mouth daily. 07/08/21   [provider]    Family History Family History  Problem Relation Age of Onset   Hypertension Mother    Hypertension Father     Social History Social History   Tobacco Use   Smoking status: Some Days    Types: Cigarettes   Smokeless tobacco: Never   Tobacco comments:    hookah occasional  Vaping Use   Vaping Use: Some days  Substance Use Topics   Alcohol use: Yes    Comment: social   Drug use: No     Allergies   Patient has no known allergies.   Review of Systems Review of Systems  HENT:  Positive for congestion.   Respiratory:  Positive for cough.      Physical Exam Triage Vital Signs ED Triage Vitals  Enc Vitals Group     BP 09/21/22 1535 (!) 129/90     Pulse Rate 09/21/22 1535 91     Resp 09/21/22 1535 16     Temp 09/21/22 1535 99 F (37.2 C)     Temp Source 09/21/22 1535 Oral     SpO2 09/21/22 1535 96 %     Weight --  Height --      Head Circumference --      Peak Flow --      Pain Score 09/21/22 1538 0     Pain Loc --      Pain Edu? --      Excl. in GC? --    No data found.  Updated Vital Signs BP (!) 129/90 (BP Location: Left Arm)   Pulse 91   Temp 99 F (37.2 C) (Oral)   Resp 16   SpO2 96%   Visual Acuity Right Eye Distance:   Left Eye Distance:   Bilateral Distance:    Right Eye Near:   Left Eye Near:    Bilateral Near:     Physical Exam Vitals and nursing note reviewed.  Constitutional:      General: She is not in acute distress.    Appearance: She is well-developed. She is not ill-appearing.  HENT:     Head: Normocephalic and atraumatic.     Right Ear: Tympanic membrane and ear canal normal.     Left Ear: Tympanic membrane and ear canal normal.     Nose: Congestion present.     Mouth/Throat:     Mouth: Mucous membranes are moist.     Pharynx: Oropharynx is clear.  Uvula midline. No oropharyngeal exudate or posterior oropharyngeal erythema.     Tonsils: No tonsillar exudate or tonsillar abscesses.  Eyes:     Conjunctiva/sclera: Conjunctivae normal.     Pupils: Pupils are equal, round, and reactive to light.  Cardiovascular:     Rate and Rhythm: Normal rate and regular rhythm.     Heart sounds: Normal heart sounds.  Pulmonary:     Effort: Pulmonary effort is normal.     Breath sounds: Normal breath sounds.  Musculoskeletal:     Cervical back: Normal range of motion and neck supple.  Lymphadenopathy:     Cervical: No cervical adenopathy.  Skin:    General: Skin is warm and dry.  Neurological:     General: No focal deficit present.     Mental Status: She is alert and oriented to person, place, and time.  Psychiatric:        Mood and Affect: Mood normal.        Behavior: Behavior normal.      UC Treatments / Results  Labs (all labs ordered are listed, but only abnormal results are displayed) Labs Reviewed  SARS CORONAVIRUS 2 (TAT 6-24 HRS)    EKG   Radiology No results found.  Procedures Procedures (including critical care time)  Medications Ordered in UC Medications - No data to display  Initial Impression / Assessment and Plan / UC Course  I have reviewed the triage vital signs and the nursing notes.  Pertinent labs & imaging results that were available during my care of the patient were reviewed by me and considered in my medical decision making (see chart for details).     Reviewed exam and symptoms with patient.  No red flags COVID PCR and will contact if positive Discussed viral illness versus allergy symptoms Trial of Zyrtec daily for at least 14 days Promethazine DM as needed for cough Flonase daily Rest and fluids PCP follow-up if symptoms do not improve ER precautions reviewed and patient verbalized understanding Final Clinical Impressions(s) / UC Diagnoses   Final diagnoses:  Acute upper respiratory  infection   Discharge Instructions   None    ED Prescriptions     Medication Sig Dispense  Auth. Provider   promethazine-dextromethorphan (PROMETHAZINE-DM) 6.25-15 MG/5ML syrup Take 5 mLs by mouth 4 (four) times daily as needed for cough. 118 mL Radford Pax, NP   fluticasone (FLONASE) 50 MCG/ACT nasal spray Place 1 spray into both nostrils daily. 15.8 mL Radford Pax, NP   cetirizine (ZYRTEC ALLERGY) 10 MG tablet Take 1 tablet (10 mg total) by mouth daily for 14 days. 14 tablet Radford Pax, NP      PDMP not reviewed this encounter.   Radford Pax, NP 09/21/22 [phone]

## 2022-09-22 DIAGNOSIS — F331 Major depressive disorder, recurrent, moderate: Secondary | ICD-10-CM | POA: Diagnosis not present

## 2022-09-22 DIAGNOSIS — F411 Generalized anxiety disorder: Secondary | ICD-10-CM | POA: Diagnosis not present

## 2022-09-22 LAB — SARS CORONAVIRUS 2 (TAT 6-24 HRS): SARS Coronavirus 2: NEGATIVE

## 2022-12-14 DIAGNOSIS — L2084 Intrinsic (allergic) eczema: Secondary | ICD-10-CM | POA: Diagnosis not present

## 2022-12-22 DIAGNOSIS — F331 Major depressive disorder, recurrent, moderate: Secondary | ICD-10-CM | POA: Diagnosis not present

## 2022-12-22 DIAGNOSIS — F429 Obsessive-compulsive disorder, unspecified: Secondary | ICD-10-CM | POA: Diagnosis not present

## 2022-12-22 DIAGNOSIS — F411 Generalized anxiety disorder: Secondary | ICD-10-CM | POA: Diagnosis not present

## 2023-02-09 DIAGNOSIS — Z01419 Encounter for gynecological examination (general) (routine) without abnormal findings: Secondary | ICD-10-CM | POA: Diagnosis not present

## 2023-02-09 DIAGNOSIS — Z6823 Body mass index (BMI) 23.0-23.9, adult: Secondary | ICD-10-CM | POA: Diagnosis not present

## 2023-02-09 DIAGNOSIS — Z113 Encounter for screening for infections with a predominantly sexual mode of transmission: Secondary | ICD-10-CM | POA: Diagnosis not present

## 2023-02-09 DIAGNOSIS — Z124 Encounter for screening for malignant neoplasm of cervix: Secondary | ICD-10-CM | POA: Diagnosis not present

## 2023-02-18 DIAGNOSIS — F331 Major depressive disorder, recurrent, moderate: Secondary | ICD-10-CM | POA: Diagnosis not present

## 2023-02-18 DIAGNOSIS — F411 Generalized anxiety disorder: Secondary | ICD-10-CM | POA: Diagnosis not present

## 2023-02-18 DIAGNOSIS — F429 Obsessive-compulsive disorder, unspecified: Secondary | ICD-10-CM | POA: Diagnosis not present

## 2023-04-07 DIAGNOSIS — J039 Acute tonsillitis, unspecified: Secondary | ICD-10-CM | POA: Diagnosis not present

## 2023-04-13 ENCOUNTER — Encounter (HOSPITAL_COMMUNITY): Payer: Self-pay

## 2023-04-13 ENCOUNTER — Ambulatory Visit (HOSPITAL_COMMUNITY)
Admission: EM | Admit: 2023-04-13 | Discharge: 2023-04-13 | Disposition: A | Discharge status: Home / Self Care | Payer: BC Managed Care – PPO | Attending: Internal Medicine | Admitting: Internal Medicine

## 2023-04-13 DIAGNOSIS — R0981 Nasal congestion: Secondary | ICD-10-CM | POA: Diagnosis not present

## 2023-04-13 DIAGNOSIS — R051 Acute cough: Secondary | ICD-10-CM

## 2023-04-13 MED ORDER — BENZONATATE 100 MG PO CAPS: 100.0000 mg | ORAL_CAPSULE | Freq: Three times a day (TID) | ORAL | 0 refills | Status: AC | PRN

## 2023-04-13 MED ORDER — ALBUTEROL SULFATE HFA 108 (90 BASE) MCG/ACT IN AERS: 1.0000 | INHALATION_SPRAY | Freq: Four times a day (QID) | RESPIRATORY_TRACT | 0 refills | Status: AC | PRN

## 2023-04-13 NOTE — ED Triage Notes (Addendum)
Pt presents with dry, non-productive cough x 4 days + nasal congestion x 3 days. Pt reports waking up this AM with concerns of "wheezing. Felt like I had to cough to stop the wheezing sound." Pt states she did go to urgent care in [facility] for sore throat approximately one week ago, steroid + antibiotic prescribed, reports improvement in sore throat. Pt is currently still taking antibiotic. Pt currently denies pain, just uncomfortable.

## 2023-04-13 NOTE — ED Provider Notes (Signed)
[facility]    CSN: [id number] Arrival date & time: 04/13/23  1151      History   Chief Complaint Chief Complaint  Patient presents with   Cough    Since 11/30    Nasal Congestion    HPI Stacey Morrow is a 37 y.o. female.   Patient presents with nasal congestion and cough that started about 4 days ago.  Reports that she had a sore throat about 1 week ago and was evaluated in urgent care in [place], [place] where they prescribed amoxicillin for 10 days and prednisone for 4 days. Sore throat has now resolved. She has completed the prednisone but is still taking the amoxicillin.  Reports that her strep test at that time was negative.  She denies any new known sick contacts.  Denies any associated fever, chest pain, shortness of breath.  Reports that she woke up this morning with some wheezing which is now resolved.  Denies history of asthma.  She does not smoke cigarettes but does report that she smokes hookah.   Cough   Past Medical History:  Diagnosis Date   Constipation    Eczema    Headache    Iron deficiency anemia     Patient Active Problem List   Diagnosis Date Noted   Smoker 10/07/2017   Chronic migraine without aura without status migrainosus, not intractable 07/24/2017    Past Surgical History:  Procedure Laterality Date   ANTERIOR CRUCIATE LIGAMENT REPAIR  2006   in [place], [place]; hurdler in high school    OB History   No obstetric history on file.      Home Medications    Prior to Admission medications   Medication Sig Start Date End Date Taking? Authorizing Provider  albuterol (VENTOLIN HFA) 108 (90 Base) MCG/ACT inhaler Inhale 1-2 puffs into the lungs every 6 (six) hours as needed for wheezing or shortness of breath. 04/13/23  Yes Kemyah Buser, Rolly Salter E, FNP  benzonatate (TESSALON) 100 MG capsule Take 1 capsule (100 mg total) by mouth every 8 (eight) hours as needed for cough. 04/13/23  Yes Yeriel Mineo, Acie Fredrickson, FNP  cetirizine (ZYRTEC  ALLERGY) 10 MG tablet Take 1 tablet (10 mg total) by mouth daily for 14 days. 09/21/22 10/05/22  Radford Pax, NP  fluticasone (FLONASE) 50 MCG/ACT nasal spray Place 1 spray into both nostrils daily. 09/21/22   Radford Pax, NP  promethazine-dextromethorphan (PROMETHAZINE-DM) 6.25-15 MG/5ML syrup Take 5 mLs by mouth 4 (four) times daily as needed for cough. 09/21/22   Radford Pax, NP  sertraline (ZOLOFT) 50 MG tablet Take by mouth.    [provider]  SUMAtriptan (IMITREX) 100 MG tablet Take 1 tablet (100 mg total) by mouth once as needed for up to 1 dose. May repeat in 2 hours if headache persists or recurs. 07/21/17   Anson Fret, MD  TRIANEX 0.05 % OINT Apply topically 2 (two) times daily as needed. For 14 days 06/20/17   [provider]  valACYclovir (VALTREX) 1000 MG tablet Take 1 tablet by mouth daily. 07/08/21   [provider]    Family History Family History  Problem Relation Age of Onset   Hypertension Mother    Hypertension Father     Social History Social History   Tobacco Use   Smoking status: Some Days    Types: Cigarettes   Smokeless tobacco: Never   Tobacco comments:    hookah occasional  Vaping Use   Vaping  status: Some Days  Substance Use Topics   Alcohol use: Yes    Comment: social   Drug use: No     Allergies   Patient has no known allergies.   Review of Systems Review of Systems Per HPI  Physical Exam Triage Vital Signs ED Triage Vitals  Encounter Vitals Group     BP 04/13/23 1226 (!) 130/110     Systolic BP Percentile --      Diastolic BP Percentile --      Pulse Rate 04/13/23 1226 72     Resp 04/13/23 1226 18     Temp 04/13/23 1226 98.3 F (36.8 C)     Temp Source 04/13/23 1226 Oral     SpO2 04/13/23 1226 98 %     Weight 04/13/23 1223 143 lb (64.9 kg)     Height 04/13/23 1223 5\' 6"  (1.676 m)     Head Circumference --      Peak Flow --      Pain Score 04/13/23 1223 0     Pain Loc --      Pain Education --       Exclude from Growth Chart --    No data found.  Updated Vital Signs BP (!) 130/110 (BP Location: Right Arm)   Pulse 72   Temp 98.3 F (36.8 C) (Oral)   Resp 18   Ht 5\' 6"  (1.676 m)   Wt 143 lb (64.9 kg)   LMP 03/19/2023 (Exact Date)   SpO2 98%   BMI 23.08 kg/m   Visual Acuity Right Eye Distance:   Left Eye Distance:   Bilateral Distance:    Right Eye Near:   Left Eye Near:    Bilateral Near:     Physical Exam Constitutional:      General: She is not in acute distress.    Appearance: Normal appearance. She is not toxic-appearing or diaphoretic.  HENT:     Head: Normocephalic and atraumatic.     Right Ear: Tympanic membrane and ear canal normal.     Left Ear: Tympanic membrane and ear canal normal.     Nose: Congestion present.     Mouth/Throat:     Mouth: Mucous membranes are moist.     Pharynx: No posterior oropharyngeal erythema.  Eyes:     Extraocular Movements: Extraocular movements intact.     Conjunctiva/sclera: Conjunctivae normal.     Pupils: Pupils are equal, round, and reactive to light.  Cardiovascular:     Rate and Rhythm: Normal rate and regular rhythm.     Pulses: Normal pulses.     Heart sounds: Normal heart sounds.  Pulmonary:     Effort: Pulmonary effort is normal. No respiratory distress.     Breath sounds: Normal breath sounds. No stridor. No wheezing, rhonchi or rales.  Musculoskeletal:        General: Normal range of motion.     Cervical back: Normal range of motion.  Skin:    General: Skin is warm and dry.  Neurological:     General: No focal deficit present.     Mental Status: She is alert and oriented to person, place, and time. Mental status is at baseline.  Psychiatric:        Mood and Affect: Mood normal.        Behavior: Behavior normal.      UC Treatments / Results  Labs (all labs ordered are listed, but only abnormal results are displayed) Labs Reviewed - No  data to display  EKG   Radiology No results  found.  Procedures Procedures (including critical care time)  Medications Ordered in UC Medications - No data to display  Initial Impression / Assessment and Plan / UC Course  I have reviewed the triage vital signs and the nursing notes.  Pertinent labs & imaging results that were available during my care of the patient were reviewed by me and considered in my medical decision making (see chart for details).     Suspect acute bronchitis versus new onset viral illness.  There are no adventitious lung sounds on exam, oxygen is normal, no tachypnea so do not think that chest imaging is necessary.  Will treat with benzonatate, albuterol inhaler as needed for wheezing, and Flonase.  Patient reports that she has Flonase at home.  Patient to complete antibiotic prescribed by other urgent care.  Blood pressure slightly elevated so encouraged her to monitor this at home.  Advised strict follow-up precautions.  Patient verbalized understanding and was agreeable with plan. Final Clinical Impressions(s) / UC Diagnoses   Final diagnoses:  Nasal congestion  Acute cough     Discharge Instructions      Please use your Flonase.  I have also prescribed you cough medication and inhaler as we discussed.  Follow-up if any symptoms persist or worsen.    ED Prescriptions     Medication Sig Dispense Auth. Provider   benzonatate (TESSALON) 100 MG capsule Take 1 capsule (100 mg total) by mouth every 8 (eight) hours as needed for cough. 21 capsule [place], Veblen E, [place]   albuterol (VENTOLIN HFA) 108 (90 Base) MCG/ACT inhaler Inhale 1-2 puffs into the lungs every 6 (six) hours as needed for wheezing or shortness of breath. 1 each Gustavus Bryant, [place]      PDMP not reviewed this encounter.   Gustavus Bryant, [place] 04/13/23 1327

## 2023-04-13 NOTE — Discharge Instructions (Signed)
Please use your Flonase.  I have also prescribed you cough medication and inhaler as we discussed.  Follow-up if any symptoms persist or worsen.

## 2023-04-14 ENCOUNTER — Ambulatory Visit: Payer: Self-pay

## 2023-05-30 ENCOUNTER — Encounter (HOSPITAL_COMMUNITY): Payer: Self-pay

## 2023-05-30 ENCOUNTER — Ambulatory Visit (INDEPENDENT_AMBULATORY_CARE_PROVIDER_SITE_OTHER): Payer: BC Managed Care – PPO

## 2023-05-30 ENCOUNTER — Ambulatory Visit (HOSPITAL_COMMUNITY)
Admission: RE | Admit: 2023-05-30 | Discharge: 2023-05-30 | Disposition: A | Discharge status: Home / Self Care | Payer: BC Managed Care – PPO | Source: Ambulatory Visit | Attending: Family Medicine | Admitting: Family Medicine

## 2023-05-30 ENCOUNTER — Ambulatory Visit: Payer: Self-pay

## 2023-05-30 ENCOUNTER — Telehealth: Payer: Self-pay

## 2023-05-30 VITALS — BP 135/85 | HR 85 | Temp 98.6°F | Resp 16

## 2023-05-30 DIAGNOSIS — M25471 Effusion, right ankle: Secondary | ICD-10-CM | POA: Diagnosis not present

## 2023-05-30 DIAGNOSIS — S82891A Other fracture of right lower leg, initial encounter for closed fracture: Secondary | ICD-10-CM

## 2023-05-30 DIAGNOSIS — M25571 Pain in right ankle and joints of right foot: Secondary | ICD-10-CM | POA: Diagnosis not present

## 2023-05-30 MED ORDER — INDOMETHACIN 50 MG PO CAPS: 50.0000 mg | ORAL_CAPSULE | Freq: Two times a day (BID) | ORAL | 0 refills | Status: AC

## 2023-05-30 NOTE — Discharge Instructions (Addendum)
Call [facility] on tomorrow advised that you have were diagnosed with an avulsion fracture at the lateral malleolus (right ankle) and seen here at urgent care and calling to schedule specialty evaluation of injury. For inflammation and swelling I have prescribed to indomethacin 50 mg twice daily for 5 days.  If the swelling subsides and you are not in any pain you can discontinue indomethacin sooner than 5 days.

## 2023-05-30 NOTE — ED Triage Notes (Signed)
Pt states she jumped up and landed on her right ankle as it twisted last night. She has it wrapped today and has taken IBU last night.

## 2023-05-30 NOTE — ED Provider Notes (Signed)
[facility]    CSN: [id number] Arrival date & time: 05/30/23  1803      History   Chief Complaint Chief Complaint  Patient presents with   Ankle Pain    Twisted my ankle and it swollen. - Entered by patient    HPI Stacey Morrow is a 38 y.o. female.    Ankle Pain   Past Medical History:  Diagnosis Date   Constipation    Eczema    Headache    Iron deficiency anemia     Patient Active Problem List   Diagnosis Date Noted   Smoker 10/07/2017   Chronic migraine without aura without status migrainosus, not intractable 07/24/2017    Past Surgical History:  Procedure Laterality Date   ANTERIOR CRUCIATE LIGAMENT REPAIR  2006   in [place], [place]; hurdler in high school    OB History   No obstetric history on file.      Home Medications    Prior to Admission medications   Medication Sig Start Date End Date Taking? Authorizing Provider  sertraline (ZOLOFT) 50 MG tablet Take by mouth.   Yes [provider]  valACYclovir (VALTREX) 1000 MG tablet Take 1 tablet by mouth daily. 07/08/21  Yes [provider]  albuterol (VENTOLIN HFA) 108 (90 Base) MCG/ACT inhaler Inhale 1-2 puffs into the lungs every 6 (six) hours as needed for wheezing or shortness of breath. 04/13/23   Gustavus Bryant, FNP  benzonatate (TESSALON) 100 MG capsule Take 1 capsule (100 mg total) by mouth every 8 (eight) hours as needed for cough. 04/13/23   Gustavus Bryant, FNP  cetirizine (ZYRTEC ALLERGY) 10 MG tablet Take 1 tablet (10 mg total) by mouth daily for 14 days. 09/21/22 10/05/22  Radford Pax, NP  fluticasone (FLONASE) 50 MCG/ACT nasal spray Place 1 spray into both nostrils daily. 09/21/22   Radford Pax, NP  promethazine-dextromethorphan (PROMETHAZINE-DM) 6.25-15 MG/5ML syrup Take 5 mLs by mouth 4 (four) times daily as needed for cough. 09/21/22   Radford Pax, NP  SUMAtriptan (IMITREX) 100 MG tablet Take 1 tablet (100 mg total) by mouth once as needed for up to 1 dose. May  repeat in 2 hours if headache persists or recurs. 07/21/17   Anson Fret, MD  TRIANEX 0.05 % OINT Apply topically 2 (two) times daily as needed. For 14 days 06/20/17   [provider]    Family History Family History  Problem Relation Age of Onset   Hypertension Mother    Hypertension Father     Social History Social History   Tobacco Use   Smoking status: Some Days    Types: Cigarettes   Smokeless tobacco: Never   Tobacco comments:    hookah occasional  Vaping Use   Vaping status: Some Days  Substance Use Topics   Alcohol use: Yes    Comment: social   Drug use: No     Allergies   Patient has no known allergies.   Review of Systems Review of Systems   Physical Exam Triage Vital Signs ED Triage Vitals  Encounter Vitals Group     BP 05/30/23 1840 135/85     Systolic BP Percentile --      Diastolic BP Percentile --      Pulse Rate 05/30/23 1840 85     Resp 05/30/23 1840 16     Temp 05/30/23 1840 98.6 F (37 C)     Temp Source 05/30/23 1840 Oral  SpO2 05/30/23 1840 96 %     Weight --      Height --      Head Circumference --      Peak Flow --      Pain Score 05/30/23 1839 3     Pain Loc --      Pain Education --      Exclude from Growth Chart --    No data found.  Updated Vital Signs BP 135/85 (BP Location: Right Arm)   Pulse 85   Temp 98.6 F (37 C) (Oral)   Resp 16   LMP 05/11/2023 (Exact Date)   SpO2 96%   Visual Acuity Right Eye Distance:   Left Eye Distance:   Bilateral Distance:    Right Eye Near:   Left Eye Near:    Bilateral Near:     Physical Exam   UC Treatments / Results  Labs (all labs ordered are listed, but only abnormal results are displayed) Labs Reviewed - No data to display  EKG   Radiology DG Ankle Complete Right Result Date: 05/30/2023 CLINICAL DATA:  Right ankle pain after twisting injury. EXAM: RIGHT ANKLE - COMPLETE 3+ VIEW COMPARISON:  None FINDINGS: Faint linear calcification below the  lateral malleolus observed, cannot exclude avulsion along the anterior talofibular ligament or calcaneofibular ligament. Plafond and talar dome appear intact. Soft tissue swelling overlies the lateral malleolus. Tibiotalar joint effusion. IMPRESSION: 1. Faint linear calcification below the lateral malleolus, suspicious for avulsion along the anterior talofibular ligament or calcaneofibular ligament. 2. Soft tissue swelling overlies the lateral malleolus. 3. Tibiotalar joint effusion. Electronically Signed   By: Gaylyn Rong M.D.   On: 05/30/2023 19:24    Procedures Procedures (including critical care time)  Medications Ordered in UC Medications - No data to display  Initial Impression / Assessment and Plan / UC Course  I have reviewed the triage vital signs and the nursing notes.  Pertinent labs & imaging results that were available during my care of the patient were reviewed by me and considered in my medical decision making (see chart for details).     *** Final Clinical Impressions(s) / UC Diagnoses   Final diagnoses:  None   Discharge Instructions   None    ED Prescriptions   None    PDMP not reviewed this encounter.

## 2023-05-30 NOTE — Telephone Encounter (Signed)
Pt scheduled an appointment at 1:30 PM today for ankle pain. In chief complaint, pt explains "unsure if I twisted my ankle, it is swollen." This RN called to inform patient that we do not have x-ray available today. This RN explained the resources we do have available. Pt verbalized understanding and appreciated that we let her know prior to her arrival. No further concerns/questions at this time.

## 2023-06-01 DIAGNOSIS — S82831A Other fracture of upper and lower end of right fibula, initial encounter for closed fracture: Secondary | ICD-10-CM | POA: Diagnosis not present

## 2023-07-25 DIAGNOSIS — F411 Generalized anxiety disorder: Secondary | ICD-10-CM | POA: Diagnosis not present

## 2023-07-25 DIAGNOSIS — F429 Obsessive-compulsive disorder, unspecified: Secondary | ICD-10-CM | POA: Diagnosis not present

## 2023-07-25 DIAGNOSIS — F331 Major depressive disorder, recurrent, moderate: Secondary | ICD-10-CM | POA: Diagnosis not present

## 2023-08-18 DIAGNOSIS — F331 Major depressive disorder, recurrent, moderate: Secondary | ICD-10-CM | POA: Diagnosis not present

## 2023-08-18 DIAGNOSIS — F411 Generalized anxiety disorder: Secondary | ICD-10-CM | POA: Diagnosis not present

## 2023-08-18 DIAGNOSIS — F429 Obsessive-compulsive disorder, unspecified: Secondary | ICD-10-CM | POA: Diagnosis not present

## 2023-10-12 DIAGNOSIS — F429 Obsessive-compulsive disorder, unspecified: Secondary | ICD-10-CM | POA: Diagnosis not present

## 2023-10-12 DIAGNOSIS — F411 Generalized anxiety disorder: Secondary | ICD-10-CM | POA: Diagnosis not present

## 2023-10-12 DIAGNOSIS — F331 Major depressive disorder, recurrent, moderate: Secondary | ICD-10-CM | POA: Diagnosis not present

## 2023-11-07 DIAGNOSIS — M25561 Pain in right knee: Secondary | ICD-10-CM | POA: Diagnosis not present

## 2023-11-07 DIAGNOSIS — M1711 Unilateral primary osteoarthritis, right knee: Secondary | ICD-10-CM | POA: Diagnosis not present

## 2023-11-16 DIAGNOSIS — R197 Diarrhea, unspecified: Secondary | ICD-10-CM | POA: Diagnosis not present

## 2023-11-16 DIAGNOSIS — R030 Elevated blood-pressure reading, without diagnosis of hypertension: Secondary | ICD-10-CM | POA: Diagnosis not present

## 2023-11-16 DIAGNOSIS — Z0000 Encounter for general adult medical examination without abnormal findings: Secondary | ICD-10-CM | POA: Diagnosis not present

## 2023-11-16 DIAGNOSIS — F419 Anxiety disorder, unspecified: Secondary | ICD-10-CM | POA: Diagnosis not present

## 2023-12-28 DIAGNOSIS — R030 Elevated blood-pressure reading, without diagnosis of hypertension: Secondary | ICD-10-CM | POA: Diagnosis not present

## 2024-01-06 DIAGNOSIS — F429 Obsessive-compulsive disorder, unspecified: Secondary | ICD-10-CM | POA: Diagnosis not present

## 2024-01-06 DIAGNOSIS — F411 Generalized anxiety disorder: Secondary | ICD-10-CM | POA: Diagnosis not present

## 2024-01-06 DIAGNOSIS — F331 Major depressive disorder, recurrent, moderate: Secondary | ICD-10-CM | POA: Diagnosis not present
